# Patient Record
Sex: Female | Born: 1990
Health system: Southern US, Community
[De-identification: ages and names within clinical notes are randomized; demographics above are authoritative.]

## PROBLEM LIST (undated history)

## (undated) DIAGNOSIS — R519 Headache, unspecified: Secondary | ICD-10-CM

## (undated) DIAGNOSIS — Z789 Other specified health status: Secondary | ICD-10-CM

## (undated) DIAGNOSIS — I10 Essential (primary) hypertension: Secondary | ICD-10-CM

## (undated) DIAGNOSIS — O139 Gestational [pregnancy-induced] hypertension without significant proteinuria, unspecified trimester: Secondary | ICD-10-CM

## (undated) HISTORY — DX: Other specified health status: Z78.9

## (undated) HISTORY — DX: Essential (primary) hypertension: I10

## (undated) HISTORY — DX: Headache, unspecified: R51.9

## (undated) HISTORY — PX: NO PAST SURGERIES: SHX2092

---
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
## 2014-03-13 ENCOUNTER — Emergency Department (HOSPITAL_COMMUNITY)
Admission: EM | Admit: 2014-03-13 | Discharge: 2014-03-13 | Disposition: A | Discharge status: Home / Self Care | Payer: BC Managed Care – PPO | Attending: Emergency Medicine | Admitting: Emergency Medicine

## 2014-03-13 ENCOUNTER — Encounter (HOSPITAL_COMMUNITY): Payer: Self-pay | Admitting: Emergency Medicine

## 2014-03-13 DIAGNOSIS — G8929 Other chronic pain: Secondary | ICD-10-CM | POA: Insufficient documentation

## 2014-03-13 DIAGNOSIS — Z880 Allergy status to penicillin: Secondary | ICD-10-CM | POA: Diagnosis not present

## 2014-03-13 DIAGNOSIS — Z8679 Personal history of other diseases of the circulatory system: Secondary | ICD-10-CM | POA: Diagnosis not present

## 2014-03-13 DIAGNOSIS — R51 Headache: Secondary | ICD-10-CM | POA: Diagnosis present

## 2014-03-13 DIAGNOSIS — R519 Headache, unspecified: Secondary | ICD-10-CM

## 2014-03-13 MED ORDER — TRAMADOL-ACETAMINOPHEN 37.5-325 MG PO TABS: 1.0000 | ORAL_TABLET | Freq: Four times a day (QID) | ORAL | Status: DC | PRN

## 2014-03-13 MED ORDER — KETOROLAC TROMETHAMINE 60 MG/2ML IM SOLN
60.0000 mg | Freq: Once | INTRAMUSCULAR | Status: AC
  Administered 2014-03-13: 60 mg via INTRAMUSCULAR
  Filled 2014-03-13: qty 2

## 2014-03-13 MED ORDER — ONDANSETRON 4 MG PO TBDP
4.0000 mg | ORAL_TABLET | Freq: Once | ORAL | Status: AC
  Administered 2014-03-13: 4 mg via ORAL
  Filled 2014-03-13: qty 1

## 2014-03-13 NOTE — ED Notes (Signed)
Pt in c/o headache every day for the past three years, states she is normally able to manage this with ibuprofen at home but reports she has had a consistent headache for three days, denies n/v, no distress noted

## 2014-03-13 NOTE — Discharge Instructions (Signed)
Take the prescribed medication as directed. Follow-up with your primary care physician or you may be seen at the [facility]. Return to the ED for new or worsening symptoms.

## 2014-03-13 NOTE — ED Provider Notes (Signed)
CSN: [id number]     Arrival date & time 03/13/14  1916 History   First MD Initiated Contact with Patient 03/13/14 2038     Chief Complaint  Patient presents with  . Headache     (Consider location/radiation/quality/duration/timing/severity/associated sxs/prior Treatment) The history is provided by the patient and medical records.   A 23 year old female with past history significant for migraine headaches, presenting to the ED for headache. Patient states for the past 3 years she has had a headache nearly every day. She states headache is usually generalized throughout her entire head associated with photophobia and occasional nausea. She states today's headache feels similar, slightly increased pain on the left side of her head. She denies any phonophobia, aura, dizziness, lightheadedness, visual disturbance, tinnitus, changes in speech, numbness or weakness of extremities, or ataxia.  No fever chills. States headache is usually well controlled with Motrin, today's headache was unrelieved by this.  Patient initially hypertensive on arrival, corrected by time of evaluation without intervention.  History reviewed. No pertinent past medical history. History reviewed. No pertinent past surgical history. History reviewed. No pertinent family history. History  Substance Use Topics  . Smoking status: Never Smoker   . Smokeless tobacco: Not on file  . Alcohol Use: Not on file   OB History   Grav Para Term Preterm Abortions TAB SAB Ect Mult Living                 Review of Systems  Neurological: Positive for headaches.  All other systems reviewed and are negative.     Allergies  Amoxicillin  Home Medications   Prior to Admission medications   Not on File   BP 122/64  Pulse 84  Temp(Src) 98.4 F (36.9 C) (Oral)  Resp 18  Ht 5\' 5"  (1.651 m)  Wt 229 lb (103.874 kg)  BMI 38.11 kg/m2  SpO2 100%  LMP 02/26/2014  Physical Exam  Nursing note and vitals  reviewed. Constitutional: She is oriented to person, place, and time. She appears well-developed and well-nourished. No distress.  HENT:  Head: Normocephalic and atraumatic.  Mouth/Throat: Oropharynx is clear and moist.  Eyes: Conjunctivae and EOM are normal. Pupils are equal, round, and reactive to light.  Neck: Trachea normal, normal range of motion and full passive range of motion without pain. Neck supple. No spinous process tenderness and no muscular tenderness present. No rigidity.  No meningismus; full ROM maintained  Cardiovascular: Normal rate, regular rhythm and normal heart sounds.   Pulmonary/Chest: Effort normal and breath sounds normal. No respiratory distress. She has no wheezes.  Abdominal: Soft. Bowel sounds are normal. There is no tenderness. There is no guarding.  Musculoskeletal: Normal range of motion. She exhibits no edema.  Neurological: She is alert and oriented to person, place, and time.  AAOx3, answering questions appropriately; equal strength UE and LE bilaterally; CN grossly intact; moves all extremities appropriately without ataxia; no focal neuro deficits or facial asymmetry appreciated  Skin: Skin is warm and dry. She is not diaphoretic.  Psychiatric: She has a normal mood and affect.    ED Course  Procedures (including critical care time) Labs Review Labs Reviewed - No data to display  Imaging Review No results found.   EKG Interpretation None      MDM   Final diagnoses:  Headache, unspecified headache type   23 year old female with history of chronic headache, presenting today for headache. Unrelieved by Motrin at home.  On exam, patient afebrile and nontoxic appearing. No  nuchal rigidity to suggest meningitis. Neurologic exam is nonfocal-- low suspicion for intracranial pathology at this time. Patient given dose of Toradol and Zofran with complete resolution of her headache. States she is feeling well enough to go home. Prescription of tramadol  for home. Encouraged follow-up with primary care physician.  Discussed plan with patient, he/she acknowledged understanding and agreed with plan of care.  Return precautions given for new or worsening symptoms.  Garlon HatchetLisa M Fabienne Nolasco, PA-C 03/13/14 2253

## 2014-03-14 NOTE — ED Provider Notes (Signed)
Medical screening examination/treatment/procedure(s) were performed by non-physician practitioner and as supervising physician I was immediately available for consultation/collaboration.   EKG Interpretation None        Matthew Gentry, MD 03/14/14 0101 

## 2015-06-03 NOTE — L&D Delivery Note (Signed)
Patient is 25 y.o. G1P0 6197w3d admitted IOL for gHTN   Delivery Note At 7:30 AM a viable female was delivered via Vaginal, Spontaneous Delivery (Presentation: cephalic; LOA).  APGAR: 9, 10; weight  pending.   Placenta status: intact with trailing membranes, sent to pathology due to foul-smell and initial elevated infant temp.  Cord: 3 vessel. Without complications Anesthesia:  epidural Episiotomy: None Lacerations: None Suture Repair: none Est. Blood Loss (mL): 150  Mom to postpartum.  Baby to Couplet care / Skin to Skin.  Rachel Ritter 02/21/2016, 7:58 AM  Patient is a G1 at 5997w3d who was admitted for IOL due to gHTN.  She progressed with augmentation via cytotec, FB, and Pitocin.  I was gloved and present for delivery in its entirety.  Second stage of labor progressed, baby delivered after approx 10 contractions.  Mild decels during second stage noted.  Complications: none  Lacerations: none  EBL: 150cc  SHAW, KIMBERLY, CNM 9:44 AM  02/21/2016         Upon arrival patient was complete and pushing. She pushed with good maternal effort to deliver a healthy baby boy. Baby delivered without difficulty, was noted to have good tone and place on maternal abdomen for oral suctioning, drying and stimulation. Delayed cord clamping performed. Placenta delivered intact with 3V cord. Vaginal canal and perineum was inspected and no repairs deemed necessary; hemostatic. Pitocin was started and uterus massaged until bleeding slowed. Counts of sharps, instruments, and lap pads were all correct.   Rachel HerElsia J Yoo, DO PGY-1 9/21/20177:58 AM

## 2015-08-02 ENCOUNTER — Ambulatory Visit (INDEPENDENT_AMBULATORY_CARE_PROVIDER_SITE_OTHER): Payer: Medicaid Other | Admitting: General Practice

## 2015-08-02 ENCOUNTER — Inpatient Hospital Stay (HOSPITAL_COMMUNITY)
Admission: AD | Admit: 2015-08-02 | Discharge: 2015-08-02 | Disposition: A | Discharge status: Home / Self Care | Payer: Medicaid Other | Source: Ambulatory Visit | Attending: Obstetrics & Gynecology | Admitting: Obstetrics & Gynecology

## 2015-08-02 ENCOUNTER — Encounter: Payer: Self-pay | Admitting: General Practice

## 2015-08-02 DIAGNOSIS — Z3201 Encounter for pregnancy test, result positive: Secondary | ICD-10-CM

## 2015-08-02 DIAGNOSIS — Z3200 Encounter for pregnancy test, result unknown: Secondary | ICD-10-CM | POA: Diagnosis present

## 2015-08-02 NOTE — MAU Note (Signed)
Pt took 3 HPT's, all positive.  Denies pain or bleeding.  Wants pregnancy test confirmed.

## 2015-08-02 NOTE — Progress Notes (Signed)
Patient here today for pregnancy test- upt +. Reports first positive home test yesterday. LMP sometime in July. Patient reports irregular cycles. U/s scheduled for 08/08/15. Gave patient new OB packet, encouraged to take PNV and schedule new OB appt. Patient wishes to start care here. Patient verbalized understanding to all & had no questions

## 2015-08-03 LAB — POCT PREGNANCY, URINE: PREG TEST UR: POSITIVE — AB

## 2015-08-08 ENCOUNTER — Other Ambulatory Visit: Payer: Self-pay | Admitting: General Practice

## 2015-08-08 ENCOUNTER — Encounter (HOSPITAL_COMMUNITY): Payer: Self-pay

## 2015-08-08 ENCOUNTER — Ambulatory Visit (HOSPITAL_COMMUNITY)
Admission: RE | Admit: 2015-08-08 | Discharge: 2015-08-08 | Disposition: A | Discharge status: Home / Self Care | Payer: Medicaid Other | Source: Ambulatory Visit | Attending: Advanced Practice Midwife | Admitting: Advanced Practice Midwife

## 2015-08-08 DIAGNOSIS — Z3201 Encounter for pregnancy test, result positive: Secondary | ICD-10-CM

## 2015-08-08 DIAGNOSIS — Z3A10 10 weeks gestation of pregnancy: Secondary | ICD-10-CM | POA: Diagnosis not present

## 2015-08-08 DIAGNOSIS — Z36 Encounter for antenatal screening of mother: Secondary | ICD-10-CM | POA: Diagnosis not present

## 2015-08-08 DIAGNOSIS — Z3687 Encounter for antenatal screening for uncertain dates: Secondary | ICD-10-CM

## 2015-09-02 ENCOUNTER — Encounter (HOSPITAL_COMMUNITY): Payer: Self-pay | Admitting: *Deleted

## 2015-09-02 ENCOUNTER — Emergency Department (HOSPITAL_COMMUNITY)
Admission: EM | Admit: 2015-09-02 | Discharge: 2015-09-02 | Disposition: A | Discharge status: Home / Self Care | Payer: Medicaid Other | Attending: Emergency Medicine | Admitting: Emergency Medicine

## 2015-09-02 DIAGNOSIS — Z79899 Other long term (current) drug therapy: Secondary | ICD-10-CM | POA: Diagnosis not present

## 2015-09-02 DIAGNOSIS — Z880 Allergy status to penicillin: Secondary | ICD-10-CM | POA: Insufficient documentation

## 2015-09-02 DIAGNOSIS — J069 Acute upper respiratory infection, unspecified: Secondary | ICD-10-CM | POA: Diagnosis not present

## 2015-09-02 DIAGNOSIS — O99511 Diseases of the respiratory system complicating pregnancy, first trimester: Secondary | ICD-10-CM | POA: Diagnosis not present

## 2015-09-02 LAB — RAPID STREP SCREEN (MED CTR MEBANE ONLY): STREPTOCOCCUS, GROUP A SCREEN (DIRECT): NEGATIVE

## 2015-09-02 MED ORDER — GUAIFENESIN 100 MG/5ML PO LIQD: 100.0000 mg | ORAL | Status: DC | PRN

## 2015-09-02 MED ORDER — ACETAMINOPHEN 325 MG PO TABS
650.0000 mg | ORAL_TABLET | Freq: Once | ORAL | Status: AC
  Administered 2015-09-02: 650 mg via ORAL
  Filled 2015-09-02: qty 2

## 2015-09-02 MED ORDER — GUAIFENESIN 100 MG/5ML PO SOLN
5.0000 mL | Freq: Once | ORAL | Status: AC
  Administered 2015-09-02: 100 mg via ORAL
  Filled 2015-09-02: qty 5

## 2015-09-02 MED ORDER — ACETAMINOPHEN ER 650 MG PO TBCR: 650.0000 mg | EXTENDED_RELEASE_TABLET | Freq: Three times a day (TID) | ORAL | Status: DC | PRN

## 2015-09-02 NOTE — ED Provider Notes (Signed)
CSN: [id number]     Arrival date & time 09/02/15  0321 History   First MD Initiated Contact with Patient 09/02/15 0557     Chief Complaint  Patient presents with  . Headache  . Sore Throat  . Cough   (Consider location/radiation/quality/duration/timing/severity/associated sxs/prior Treatment) Patient is a 25 y.o. female presenting with headaches, pharyngitis, and cough. The history is provided by the patient. No language interpreter was used.  Headache Associated symptoms: cough and fever   Associated symptoms: no vomiting   Sore Throat Associated symptoms include coughing, a fever and headaches. Pertinent negatives include no chills or vomiting.  Cough Associated symptoms: fever and headaches   Associated symptoms: no chills    Miss Jon BillingsMorrison is a 25 year old female with no significant past medical history who is [redacted] weeks pregnant and here for gradual onset green sputum cough, headache, and sore throat that began 6 days ago. Reports she had a fever 6 days ago which resolved on its own. Denies any treatment prior to arrival. Denies any history of smoking. States she has had headaches in the past and that this is not new for her. Denies any vision changes, dizziness, lightheadedness. Denies any chills, night sweats, shortness of breath, nausea, vomiting.   History reviewed. No pertinent past medical history. History reviewed. No pertinent past surgical history. No family history on file. Social History  Substance Use Topics  . Smoking status: Never Smoker   . Smokeless tobacco: None  . Alcohol Use: No   OB History    Gravida Para Term Preterm AB TAB SAB Ectopic Multiple Living   1              Review of Systems  Constitutional: Positive for fever. Negative for chills.  Respiratory: Positive for cough.   Gastrointestinal: Negative for vomiting.  Neurological: Positive for headaches.  All other systems reviewed and are negative.     Allergies  Amoxicillin  Home  Medications   Prior to Admission medications   Medication Sig Start Date End Date Taking? Authorizing Provider  Prenatal Vit-Fe Fumarate-FA (PRENATAL MULTIVITAMIN) TABS tablet Take 2 tablets by mouth daily at 12 noon.   Yes Historical Provider, MD  acetaminophen (TYLENOL 8 HOUR) 650 MG CR tablet Take 1 tablet (650 mg total) by mouth every 8 (eight) hours as needed for pain. 09/02/15   Tieshia Rettinger Patel-Mills, PA-C  guaiFENesin (ROBITUSSIN) 100 MG/5ML liquid Take 5-10 mLs (100-200 mg total) by mouth every 4 (four) hours as needed for cough. 09/02/15   Dontasia Miranda Patel-Mills, PA-C   BP 129/92 mmHg  Pulse 93  Temp(Src) 98.3 F (36.8 C) (Oral)  Resp 32  SpO2 98% Physical Exam  Constitutional: She is oriented to person, place, and time. She appears well-developed and well-nourished. No distress.  HENT:  Head: Normocephalic and atraumatic.  Oropharynx is clear and moist. No tonsillar edema or exudates. Uvula midline. No trismus or drooling. No anterior cervical lymphadenopathy.  Eyes: Conjunctivae are normal.  Neck: Normal range of motion and full passive range of motion without pain. Neck supple. No spinous process tenderness present. No rigidity. Normal range of motion present. No Brudzinski's sign noted.  No meningeal signs. Able to touch chin to chest. Normal passive range of motion without pain.  Cardiovascular: Normal rate, regular rhythm and normal heart sounds.   Pulmonary/Chest: Effort normal and breath sounds normal.  Regular rate and rhythm. No murmur.  Lungs clear to auscultation bilaterally. No decreased breath sounds or wheezing.  Abdominal: Soft. There is no  tenderness.  Obese abdomen.  Musculoskeletal: Normal range of motion.  Neurological: She is alert and oriented to person, place, and time.  Skin: Skin is warm and dry.  Nursing note and vitals reviewed.   ED Course  Procedures (including critical care time) Labs Review Labs Reviewed  RAPID STREP SCREEN (NOT AT [facility])  CULTURE,  GROUP A STREP [facility])    Imaging Review No results found. I have personally reviewed and evaluated these lab results as part of my medical decision-making.   EKG Interpretation None      MDM   Final diagnoses:  URI, acute   Patient presents for URI like symptoms with headache, cough, and sore throat. Strep negative. This is most likely a viral upper respiratory infection. No signs of meningitis or pneumonia. States headaches are similar to previous headaches in the past. I discussed return precautions with the patient as well as follow-up. She was given Robitussin and Tylenol. Patient agrees with plan. Medications  guaiFENesin (ROBITUSSIN) 100 MG/5ML solution 100 mg (100 mg Oral Given 09/02/15 0651)  acetaminophen (TYLENOL) tablet 650 mg (650 mg Oral Given 09/02/15 0651)   Filed Vitals:   09/02/15 0615 09/02/15 0700  BP: 109/73 129/92  Pulse: 112 93  Temp:    Resp: 21 [address] PA-C 09/02/15 6063  Jerelyn Scott, MD 09/02/15 [id number]

## 2015-09-02 NOTE — ED Notes (Signed)
Pt is [redacted] weeks pregnant and here with cough with green sputum, headache, and sore throat.  No fever, no distress

## 2015-09-02 NOTE — ED Notes (Signed)
Pt reports that she is [redacted] weeks pregnant and does not have an OB.

## 2015-09-02 NOTE — Discharge Instructions (Signed)
Upper Respiratory Infection, Adult Follow up with women's outpatient clinic. Most upper respiratory infections (URIs) are caused by a virus. A URI affects the nose, throat, and upper air passages. The most common type of URI is often called "the common cold." HOME CARE   Take medicines only as told by your doctor.  Gargle warm saltwater or take cough drops to comfort your throat as told by your doctor.  Use a warm mist humidifier or inhale steam from a shower to increase air moisture. This may make it easier to breathe.  Drink enough fluid to keep your pee (urine) clear or pale yellow.  Eat soups and other clear broths.  Have a healthy diet.  Rest as needed.  Go back to work when your fever is gone or your doctor says it is okay.  You may need to stay home longer to avoid giving your URI to others.  You can also wear a face mask and wash your hands often to prevent spread of the virus.  Use your inhaler more if you have asthma.  Do not use any tobacco products, including cigarettes, chewing tobacco, or electronic cigarettes. If you need help quitting, ask your doctor. GET HELP IF:  You are getting worse, not better.  Your symptoms are not helped by medicine.  You have chills.  You are getting more short of breath.  You have brown or red mucus.  You have yellow or brown discharge from your nose.  You have pain in your face, especially when you bend forward.  You have a fever.  You have puffy (swollen) neck glands.  You have pain while swallowing.  You have white areas in the back of your throat. GET HELP RIGHT AWAY IF:   You have very bad or constant:  Headache.  Ear pain.  Pain in your forehead, behind your eyes, and over your cheekbones (sinus pain).  Chest pain.  You have long-lasting (chronic) lung disease and any of the following:  Wheezing.  Long-lasting cough.  Coughing up blood.  A change in your usual mucus.  You have a stiff  neck.  You have changes in your:  Vision.  Hearing.  Thinking.  Mood. MAKE SURE YOU:   Understand these instructions.  Will watch your condition.  Will get help right away if you are not doing well or get worse.   This information is not intended to replace advice given to you by your health care provider. Make sure you discuss any questions you have with your health care provider.   Document Released: 11/05/2007 Document Revised: 10/03/2014 Document Reviewed: 08/24/2013 Elsevier Interactive Patient Education [employer]2016 [employer].

## 2015-09-02 NOTE — ED Notes (Signed)
Pt verbalizes understanding of instructions. 

## 2015-09-04 LAB — CULTURE, GROUP A STREP (THRC)

## 2015-09-05 ENCOUNTER — Other Ambulatory Visit (HOSPITAL_COMMUNITY)
Admission: RE | Admit: 2015-09-05 | Discharge: 2015-09-05 | Disposition: A | Discharge status: Home / Self Care | Payer: Medicaid Other | Source: Ambulatory Visit | Attending: Certified Nurse Midwife | Admitting: Certified Nurse Midwife

## 2015-09-05 ENCOUNTER — Encounter: Payer: Self-pay | Admitting: Certified Nurse Midwife

## 2015-09-05 ENCOUNTER — Ambulatory Visit (INDEPENDENT_AMBULATORY_CARE_PROVIDER_SITE_OTHER): Payer: Medicaid Other | Admitting: Certified Nurse Midwife

## 2015-09-05 VITALS — BP 132/87 | HR 111 | Wt 235.7 lb

## 2015-09-05 DIAGNOSIS — Z113 Encounter for screening for infections with a predominantly sexual mode of transmission: Secondary | ICD-10-CM | POA: Insufficient documentation

## 2015-09-05 DIAGNOSIS — Z3402 Encounter for supervision of normal first pregnancy, second trimester: Secondary | ICD-10-CM | POA: Diagnosis not present

## 2015-09-05 DIAGNOSIS — Z1389 Encounter for screening for other disorder: Secondary | ICD-10-CM

## 2015-09-05 DIAGNOSIS — Z23 Encounter for immunization: Secondary | ICD-10-CM | POA: Diagnosis not present

## 2015-09-05 LAB — POCT URINALYSIS DIP (DEVICE)
Bilirubin Urine: NEGATIVE
Glucose, UA: NEGATIVE mg/dL
Hgb urine dipstick: NEGATIVE
Ketones, ur: NEGATIVE mg/dL
Leukocytes, UA: NEGATIVE
Nitrite: NEGATIVE
Protein, ur: NEGATIVE mg/dL
Specific Gravity, Urine: 1.02 (ref 1.005–1.030)
Urobilinogen, UA: 1 mg/dL (ref 0.0–1.0)
pH: 6.5 (ref 5.0–8.0)

## 2015-09-05 NOTE — Progress Notes (Signed)
   Subjective:    Rachel Ritter is a G1P0 6847w2d being seen today for her first obstetrical visit.  Her obstetrical history is significant for none. Patient undecided intend to breast feed. Pregnancy history fully reviewed.  Patient reports no complaints.  Filed Vitals:   09/05/15 0912  BP: 132/87  Pulse: 111  Weight: 235 lb 11.2 oz (106.913 kg)    HISTORY: OB History  Gravida Para Term Preterm AB SAB TAB Ectopic Multiple Living  1             # Outcome Date GA Lbr Len/2nd Weight Sex Delivery Anes PTL Lv  1 Current              History reviewed. No pertinent past medical history. History reviewed. No pertinent past surgical history. History reviewed. No pertinent family history.   Exam    Uterus:     Pelvic Exam:    Perineum:    Vulva:    Vagina:     pH:    Cervix:    Adnexa:    Bony Pelvis:   System: Breast:     Skin: normal coloration and turgor, no rashes    Neurologic: oriented, normal   Extremities: normal strength, tone, and muscle mass   HEENT    Mouth/Teeth mucous membranes moist, pharynx normal without lesions   Neck supple and no masses   Cardiovascular: regular rate and rhythm   Respiratory:  appears well, vitals normal, no respiratory distress, acyanotic, normal RR, ear and throat exam is normal, neck free of mass or lymphadenopathy, chest clear, no wheezing, crepitations, rhonchi, normal symmetric air entry   Abdomen: soft, non-tender; bowel sounds normal; no masses,  no organomegaly   Urinary:       Assessment:    Pregnancy: G1P0 Patient Active Problem List   Diagnosis Date Noted  . Supervision of normal first pregnancy in second trimester 09/05/2015        Plan:     Initial labs drawn. Prenatal vitamins. Problem list reviewed and updated. Genetic Screening discussed : declined.  Ultrasound discussed; fetal survey: declined.  Follow up in 4 weeks. 50% of 30 min visit spent on counseling and coordination of care.     Clemmons,Lori Grissett 09/05/2015

## 2015-09-05 NOTE — Patient Instructions (Signed)
Safe Medications in Pregnancy   Acne: Benzoyl Peroxide Salicylic Acid  Backache/Headache: Tylenol: 2 regular strength every 4 hours OR              2 Extra strength every 6 hours  Colds/Coughs/Allergies: Benadryl (alcohol free) 25 mg every 6 hours as needed Breath right strips Claritin Cepacol throat lozenges Chloraseptic throat spray Cold-Eeze- up to three times per day Cough drops, alcohol free Flonase (by prescription only) Guaifenesin Mucinex Robitussin DM (plain only, alcohol free) Saline nasal spray/drops Sudafed (pseudoephedrine) & Actifed ** use only after [redacted] weeks gestation and if you do not have high blood pressure Tylenol Vicks Vaporub Zinc lozenges Zyrtec   Constipation: Colace Ducolax suppositories Fleet enema Glycerin suppositories Metamucil Milk of magnesia Miralax Senokot Smooth move tea  Diarrhea: Kaopectate Imodium A-D  *NO pepto Bismol  Hemorrhoids: Anusol Anusol HC Preparation H Tucks  Indigestion: Tums Maalox Mylanta Zantac  Pepcid  Insomnia: Benadryl (alcohol free) 25mg  every 6 hours as needed Tylenol PM Unisom, no Gelcaps  Leg Cramps: Tums MagGel  Nausea/Vomiting:  Bonine Dramamine Emetrol Ginger extract Sea bands Meclizine  Nausea medication to take during pregnancy:  Unisom (doxylamine succinate 25 mg tablets) Take one tablet daily at bedtime. If symptoms are not adequately controlled, the dose can be increased to a maximum recommended dose of two tablets daily (1/2 tablet in the morning, 1/2 tablet mid-afternoon and one at bedtime). Vitamin B6 100mg  tablets. Take one tablet twice a day (up to 200 mg per day).  Skin Rashes: Aveeno products Benadryl cream or 25mg  every 6 hours as needed Calamine Lotion 1% cortisone cream  Yeast infection: Gyne-lotrimin 7 Monistat 7   **If taking multiple medications, please check labels to avoid duplicating the same active ingredients **take medication as directed on  the label ** Do not exceed 4000 mg of tylenol in 24 hours **Do not take medications that contain aspirin or ibuprofen   How a Baby Grows During Pregnancy Pregnancy begins when a female's sperm enters a female's egg (fertilization). This happens in one of the tubes (fallopian tubes) that connect the ovaries to the womb (uterus). The fertilized egg is called an embryo until it reaches 10 weeks. From 10 weeks until birth, it is called a fetus. The fertilized egg moves down the fallopian tube to the uterus. Then it implants into the lining of the uterus and begins to grow. The developing fetus receives oxygen and nutrients through the pregnant woman's bloodstream and the tissues that grow (placenta) to support the fetus. The placenta is the life support system for the fetus. It provides nutrition and removes waste. Learning as much as you can about your pregnancy and how your baby is developing can help you enjoy the experience. It can also make you aware of when there might be a problem and when to ask questions. HOW LONG DOES A TYPICAL PREGNANCY LAST? A pregnancy usually lasts 280 days, or about 40 weeks. Pregnancy is divided into three trimesters:  First trimester: 0-13 weeks.  Second trimester: 14-27 weeks.  Third trimester: 28-40 weeks. The day when your baby is considered ready to be born (full term) is your estimated date of delivery. HOW DOES MY BABY DEVELOP MONTH BY MONTH? First month  The fertilized egg attaches to the inside of the uterus.  Some cells will form the placenta. Others will form the fetus.  The arms, legs, brain, spinal cord, lungs, and heart begin to develop.  At the end of the first month, the heart  begins to beat. Second month  The bones, inner ear, eyelids, hands, and feet form.  The genitals develop.  By the end of 8 weeks, all major organs are developing. Third month  All of the internal organs are forming.  Teeth develop below the gums.  Bones and  muscles begin to grow. The spine can flex.  The skin is transparent.  Fingernails and toenails begin to form.  Arms and legs continue to grow longer, and hands and feet develop.  The fetus is about 3 in (7.6 cm) long. Fourth month  The placenta is completely formed.  The external sex organs, neck, outer ear, eyebrows, eyelids, and fingernails are formed.  The fetus can hear, swallow, and move its arms and legs.  The kidneys begin to produce urine.  The skin is covered with a white waxy coating (vernix) and very fine hair (lanugo). Fifth month  The fetus moves around more and can be felt for the first time (quickening).  The fetus starts to sleep and wake up and may begin to suck its finger.  The nails grow to the end of the fingers.  The organ in the digestive system that makes bile (gallbladder) functions and helps to digest the nutrients.  If your baby is a girl, eggs are present in her ovaries. If your baby is a boy, testicles start to move down into his scrotum. Sixth month  The lungs are formed, but the fetus is not yet able to breathe.  The eyes open. The brain continues to develop.  Your baby has fingerprints and toe prints. Your baby's hair grows thicker.  At the end of the second trimester, the fetus is about 9 in (22.9 cm) long. Seventh month  The fetus kicks and stretches.  The eyes are developed enough to sense changes in light.  The hands can make a grasping motion.  The fetus responds to sound. Eighth month  All organs and body systems are fully developed and functioning.  Bones harden and taste buds develop. The fetus may hiccup.  Certain areas of the brain are still developing. The skull remains soft. Ninth month  The fetus gains about  lb (0.23 kg) each week.  The lungs are fully developed.  Patterns of sleep develop.  The fetus's head typically moves into a head-down position (vertex) in the uterus to prepare for birth. If the  buttocks move into a vertex position instead, the baby is breech.  The fetus weighs 6-9 lbs (2.72-4.08 kg) and is 19-20 in (48.26-50.8 cm) long. WHAT CAN I DO TO HAVE A HEALTHY PREGNANCY AND HELP MY BABY DEVELOP? Eating and Drinking  Eat a healthy diet.  Talk with your health care provider to make sure that you are getting the nutrients that you and your baby need.  Visit www.DisposableNylon.be to learn about creating a healthy diet.  Gain a healthy amount of weight during pregnancy as advised by your health care provider. This is usually 25-35 pounds. You may need to:  Gain more if you were underweight before getting pregnant or if you are pregnant with more than one baby.  Gain less if you were overweight or obese when you got pregnant. Medicines and Vitamins  Take prenatal vitamins as directed by your health care provider. These include vitamins such as folic acid, iron, calcium, and vitamin D. They are important for healthy development.  Take medicines only as directed by your health care provider. Read labels and ask a pharmacist or your health care  provider whether over-the-counter medicines, supplements, and prescription drugs are safe to take during pregnancy. Activities  Be physically active as advised by your health care provider. Ask your health care provider to recommend activities that are safe for you to do, such as walking or swimming.  Do not participate in strenuous or extreme sports. Lifestyle  Do not drink alcohol.  Do not use any tobacco products, including cigarettes, chewing tobacco, or electronic cigarettes. If you need help quitting, ask your health care provider.  Do not use illegal drugs. Safety  Avoid exposure to mercury, lead, or other heavy metals. Ask your health care provider about common sources of these heavy metals.  Avoid listeria infection during pregnancy. Follow these precautions:  Do not eat soft cheeses or deli meats.  Do not eat hot  dogs unless they have been warmed up to the point of steaming, such as in the microwave oven.  Do not drink unpasteurized milk.  Avoid toxoplasmosis infection during pregnancy. Follow these precautions:  Do not change your cat's litter box, if you have a cat. Ask someone else to do this for you.  Wear gardening gloves while working in the yard. General Instructions  Keep all follow-up visits as directed by your health care provider. This is important. This includes prenatal care and screening tests.  Manage any chronic health conditions. Work closely with your health care provider to keep conditions, such as diabetes, under control. HOW DO I KNOW IF MY BABY IS DEVELOPING WELL? At each prenatal visit, your health care provider will do several different tests to check on your health and keep track of your baby's development. These include:  Fundal height.  Your health care provider will measure your growing belly from top to bottom using a tape measure.  Your health care provider will also feel your belly to determine your baby's position.  Heartbeat.  An ultrasound in the first trimester can confirm pregnancy and show a heartbeat, depending on how far along you are.  Your health care provider will check your baby's heart rate at every prenatal visit.  As you get closer to your delivery date, you may have regular fetal heart rate monitoring to make sure that your baby is not in distress.  Second trimester ultrasound.  This ultrasound checks your baby's development. It also indicates your baby's gender. WHAT SHOULD I DO IF I HAVE CONCERNS ABOUT MY BABY'S DEVELOPMENT? Always talk with your health care provider about any concerns that you may have.   This information is not intended to replace advice given to you by your health care provider. Make sure you discuss any questions you have with your health care provider.   Document Released: 11/05/2007 Document Revised: 02/07/2015  Document Reviewed: 10/26/2013 Elsevier Interactive Patient Education 2016 [employer] Influenza Virus Vaccine injection What is this medicine? INFLUENZA VIRUS VACCINE (in floo EN zuh VAHY ruhs vak SEEN) helps to reduce the risk of getting influenza also known as the flu. The vaccine only helps protect you against some strains of the flu. This medicine may be used for other purposes; ask your health care provider or pharmacist if you have questions. What should I tell my health care provider before I take this medicine? They need to know if you have any of these conditions: -bleeding disorder like hemophilia -fever or infection -Guillain-Barre syndrome or other neurological problems -immune system problems -infection with the human immunodeficiency virus (HIV) or AIDS -low blood platelet counts -multiple sclerosis -an unusual or allergic reaction  to influenza virus vaccine, latex, other medicines, foods, dyes, or preservatives. Different brands of vaccines contain different allergens. Some may contain latex or eggs. Talk to your doctor about your allergies to make sure that you get the right vaccine. -pregnant or trying to get pregnant -breast-feeding How should I use this medicine? This vaccine is for injection into a muscle or under the skin. It is given by a health care professional. A copy of Vaccine Information Statements will be given before each vaccination. Read this sheet carefully each time. The sheet may change frequently. Talk to your healthcare provider to see which vaccines are right for you. Some vaccines should not be used in all age groups. Overdosage: If you think you have taken too much of this medicine contact a poison control center or emergency room at once. NOTE: This medicine is only for you. Do not share this medicine with others. What if I miss a dose? This does not apply. What may interact with this medicine? -chemotherapy or radiation therapy -medicines that  lower your immune system like etanercept, anakinra, infliximab, and adalimumab -medicines that treat or prevent blood clots like warfarin -phenytoin -steroid medicines like prednisone or cortisone -theophylline -vaccines This list may not describe all possible interactions. Give your health care provider a list of all the medicines, herbs, non-prescription drugs, or dietary supplements you use. Also tell them if you smoke, drink alcohol, or use illegal drugs. Some items may interact with your medicine. What should I watch for while using this medicine? Report any side effects that do not go away within 3 days to your doctor or health care professional. Call your health care provider if any unusual symptoms occur within 6 weeks of receiving this vaccine. You may still catch the flu, but the illness is not usually as bad. You cannot get the flu from the vaccine. The vaccine will not protect against colds or other illnesses that may cause fever. The vaccine is needed every year. What side effects may I notice from receiving this medicine? Side effects that you should report to your doctor or health care professional as soon as possible: -allergic reactions like skin rash, itching or hives, swelling of the face, lips, or tongue Side effects that usually do not require medical attention (report to your doctor or health care professional if they continue or are bothersome): -fever -headache -muscle aches and pains -pain, tenderness, redness, or swelling at the injection site -tiredness This list may not describe all possible side effects. Call your doctor for medical advice about side effects. You may report side effects to FDA at 1-800-FDA-1088. Where should I keep my medicine? The vaccine will be given by a health care professional in a clinic, pharmacy, doctor's office, or other health care setting. You will not be given vaccine doses to store at home. NOTE: This sheet is a summary. It may not  cover all possible information. If you have questions about this medicine, talk to your doctor, pharmacist, or health care provider.    2016, Elsevier/[employer]. (2014-12-08 10:07:28) Second Trimester of Pregnancy The second trimester is from week 13 through week 28, months 4 through 6. The second trimester is often a time when you feel your best. Your body has also adjusted to being pregnant, and you begin to feel better physically. Usually, morning sickness has lessened or quit completely, you may have more energy, and you may have an increase in appetite. The second trimester is also a time when the fetus is  growing rapidly. At the end of the sixth month, the fetus is about 9 inches long and weighs about 1 pounds. You will likely begin to feel the baby move (quickening) between 18 and 20 weeks of the pregnancy. BODY CHANGES Your body goes through many changes during pregnancy. The changes vary from woman to woman.   Your weight will continue to increase. You will notice your lower abdomen bulging out.  You may begin to get stretch marks on your hips, abdomen, and breasts.  You may develop headaches that can be relieved by medicines approved by your health care provider.  You may urinate more often because the fetus is pressing on your bladder.  You may develop or continue to have heartburn as a result of your pregnancy.  You may develop constipation because certain hormones are causing the muscles that push waste through your intestines to slow down.  You may develop hemorrhoids or swollen, bulging veins (varicose veins).  You may have back pain because of the weight gain and pregnancy hormones relaxing your joints between the bones in your pelvis and as a result of a shift in weight and the muscles that support your balance.  Your breasts will continue to grow and be tender.  Your gums may bleed and may be sensitive to brushing and flossing.  Dark spots or blotches (chloasma,  mask of pregnancy) may develop on your face. This will likely fade after the baby is born.  A dark line from your belly button to the pubic area (linea nigra) may appear. This will likely fade after the baby is born.  You may have changes in your hair. These can include thickening of your hair, rapid growth, and changes in texture. Some women also have hair loss during or after pregnancy, or hair that feels dry or thin. Your hair will most likely return to normal after your baby is born. WHAT TO EXPECT AT YOUR PRENATAL VISITS During a routine prenatal visit:  You will be weighed to make sure you and the fetus are growing normally.  Your blood pressure will be taken.  Your abdomen will be measured to track your baby's growth.  The fetal heartbeat will be listened to.  Any test results from the previous visit will be discussed. Your health care provider may ask you:  How you are feeling.  If you are feeling the baby move.  If you have had any abnormal symptoms, such as leaking fluid, bleeding, severe headaches, or abdominal cramping.  If you are using any tobacco products, including cigarettes, chewing tobacco, and electronic cigarettes.  If you have any questions. Other tests that may be performed during your second trimester include:  Blood tests that check for:  Low iron levels (anemia).  Gestational diabetes (between 24 and 28 weeks).  Rh antibodies.  Urine tests to check for infections, diabetes, or protein in the urine.  An ultrasound to confirm the proper growth and development of the baby.  An amniocentesis to check for possible genetic problems.  Fetal screens for spina bifida and Down syndrome.  HIV (human immunodeficiency virus) testing. Routine prenatal testing includes screening for HIV, unless you choose not to have this test. HOME CARE INSTRUCTIONS   Avoid all smoking, herbs, alcohol, and unprescribed drugs. These chemicals affect the formation and growth  of the baby.  Do not use any tobacco products, including cigarettes, chewing tobacco, and electronic cigarettes. If you need help quitting, ask your health care provider. You may receive counseling support  and other resources to help you quit.  Follow your health care provider's instructions regarding medicine use. There are medicines that are either safe or unsafe to take during pregnancy.  Exercise only as directed by your health care provider. Experiencing uterine cramps is a good sign to stop exercising.  Continue to eat regular, healthy meals.  Wear a good support bra for breast tenderness.  Do not use hot tubs, steam rooms, or saunas.  Wear your seat belt at all times when driving.  Avoid raw meat, uncooked cheese, cat litter boxes, and soil used by cats. These carry germs that can cause birth defects in the baby.  Take your prenatal vitamins.  Take 1500-2000 mg of calcium daily starting at the 20th week of pregnancy until you deliver your baby.  Try taking a stool softener (if your health care provider approves) if you develop constipation. Eat more high-fiber foods, such as fresh vegetables or fruit and whole grains. Drink plenty of fluids to keep your urine clear or pale yellow.  Take warm sitz baths to soothe any pain or discomfort caused by hemorrhoids. Use hemorrhoid cream if your health care provider approves.  If you develop varicose veins, wear support hose. Elevate your feet for 15 minutes, 3-4 times a day. Limit salt in your diet.  Avoid heavy lifting, wear low heel shoes, and practice good posture.  Rest with your legs elevated if you have leg cramps or low back pain.  Visit your dentist if you have not gone yet during your pregnancy. Use a soft toothbrush to brush your teeth and be gentle when you floss.  A sexual relationship may be continued unless your health care provider directs you otherwise.  Continue to go to all your prenatal visits as directed by your  health care provider. SEEK MEDICAL CARE IF:   You have dizziness.  You have mild pelvic cramps, pelvic pressure, or nagging pain in the abdominal area.  You have persistent nausea, vomiting, or diarrhea.  You have a bad smelling vaginal discharge.  You have pain with urination. SEEK IMMEDIATE MEDICAL CARE IF:   You have a fever.  You are leaking fluid from your vagina.  You have spotting or bleeding from your vagina.  You have severe abdominal cramping or pain.  You have rapid weight gain or loss.  You have shortness of breath with chest pain.  You notice sudden or extreme swelling of your face, hands, ankles, feet, or legs.  You have not felt your baby move in over an hour.  You have severe headaches that do not go away with medicine.  You have vision changes.   This information is not intended to replace advice given to you by your health care provider. Make sure you discuss any questions you have with your health care provider.   Document Released: 05/13/2001 Document Revised: 06/09/2014 Document Reviewed: 07/20/2012 Elsevier Interactive Patient Education [employer]2016 [employer].

## 2015-09-06 LAB — PRENATAL PROFILE (SOLSTAS)
Antibody Screen: NEGATIVE
Basophils Absolute: 0 cells/uL (ref 0–200)
Basophils Relative: 0 %
Eosinophils Absolute: 0 cells/uL — ABNORMAL LOW (ref 15–500)
Eosinophils Relative: 0 %
HCT: 38.3 % (ref 35.0–45.0)
HIV 1&2 Ab, 4th Generation: NONREACTIVE
Hemoglobin: 12.8 g/dL (ref 11.7–15.5)
Hepatitis B Surface Ag: NEGATIVE
Lymphocytes Relative: 22 %
Lymphs Abs: 1782 cells/uL (ref 850–3900)
MCH: 28.9 pg (ref 27.0–33.0)
MCHC: 33.4 g/dL (ref 32.0–36.0)
MCV: 86.5 fL (ref 80.0–100.0)
MPV: 8.9 fL (ref 7.5–12.5)
Monocytes Absolute: 405 cells/uL (ref 200–950)
Monocytes Relative: 5 %
Neutro Abs: 5913 cells/uL (ref 1500–7800)
Neutrophils Relative %: 73 %
Platelets: 298 10*3/uL (ref 140–400)
RBC: 4.43 MIL/uL (ref 3.80–5.10)
RDW: 14.2 % (ref 11.0–15.0)
Rh Type: POSITIVE
Rubella: 2.49 Index — ABNORMAL HIGH (ref <0.90)
WBC: 8.1 10*3/uL (ref 3.8–10.8)

## 2015-09-06 LAB — GC/CHLAMYDIA PROBE AMP (~~LOC~~) NOT AT ARMC
Chlamydia: NEGATIVE
Neisseria Gonorrhea: NEGATIVE

## 2015-09-06 LAB — CULTURE, OB URINE: Colony Count: 4000

## 2015-09-07 LAB — PRESCRIPTION MONITORING PROFILE (19 PANEL)
Amphetamine/Meth: NEGATIVE ng/mL
Barbiturate Screen, Urine: NEGATIVE ng/mL
Benzodiazepine Screen, Urine: NEGATIVE ng/mL
Buprenorphine, Urine: NEGATIVE ng/mL
Cannabinoid Scrn, Ur: NEGATIVE ng/mL
Carisoprodol, Urine: NEGATIVE ng/mL
Cocaine Metabolites: NEGATIVE ng/mL
Creatinine, Urine: 153 mg/dL (ref >20.0)
Fentanyl, Ur: NEGATIVE ng/mL
MDMA URINE: NEGATIVE ng/mL
Meperidine, Ur: NEGATIVE ng/mL
Methadone Screen, Urine: NEGATIVE ng/mL
Methaqualone: NEGATIVE ng/mL
Nitrites, Initial: NEGATIVE ug/mL
Opiate Screen, Urine: NEGATIVE ng/mL
Oxycodone Screen, Ur: NEGATIVE ng/mL
Phencyclidine, Ur: NEGATIVE ng/mL
Propoxyphene: NEGATIVE ng/mL
Tapentadol, urine: NEGATIVE ng/mL
Tramadol Scrn, Ur: NEGATIVE ng/mL
Zolpidem, Urine: NEGATIVE ng/mL
pH, Initial: 6.9 pH (ref 4.5–8.9)

## 2015-10-03 ENCOUNTER — Encounter: Payer: Self-pay | Admitting: Student

## 2015-10-03 ENCOUNTER — Ambulatory Visit (INDEPENDENT_AMBULATORY_CARE_PROVIDER_SITE_OTHER): Payer: Medicaid Other | Admitting: Student

## 2015-10-03 VITALS — BP 114/89 | HR 86 | Wt 236.8 lb

## 2015-10-03 DIAGNOSIS — Z3402 Encounter for supervision of normal first pregnancy, second trimester: Secondary | ICD-10-CM

## 2015-10-03 DIAGNOSIS — E669 Obesity, unspecified: Secondary | ICD-10-CM

## 2015-10-03 DIAGNOSIS — O99212 Obesity complicating pregnancy, second trimester: Secondary | ICD-10-CM

## 2015-10-03 LAB — POCT URINALYSIS DIP (DEVICE)
Bilirubin Urine: NEGATIVE
GLUCOSE, UA: NEGATIVE mg/dL
Hgb urine dipstick: NEGATIVE
Ketones, ur: NEGATIVE mg/dL
LEUKOCYTES UA: NEGATIVE
NITRITE: NEGATIVE
Protein, ur: NEGATIVE mg/dL
SPECIFIC GRAVITY, URINE: 1.02 (ref 1.005–1.030)
UROBILINOGEN UA: 1 mg/dL (ref 0.0–1.0)
pH: 7 (ref 5.0–8.0)

## 2015-10-03 NOTE — Patient Instructions (Addendum)
Second Trimester of Pregnancy The second trimester is from week 13 through week 28, months 4 through 6. The second trimester is often a time when you feel your best. Your body has also adjusted to being pregnant, and you begin to feel better physically. Usually, morning sickness has lessened or quit completely, you may have more energy, and you may have an increase in appetite. The second trimester is also a time when the fetus is growing rapidly. At the end of the sixth month, the fetus is about 9 inches long and weighs about 1 pounds. You will likely begin to feel the baby move (quickening) between 18 and 20 weeks of the pregnancy. BODY CHANGES Your body goes through many changes during pregnancy. The changes vary from woman to woman.   Your weight will continue to increase. You will notice your lower abdomen bulging out.  You may begin to get stretch marks on your hips, abdomen, and breasts.  You may develop headaches that can be relieved by medicines approved by your health care provider.  You may urinate more often because the fetus is pressing on your bladder.  You may develop or continue to have heartburn as a result of your pregnancy.  You may develop constipation because certain hormones are causing the muscles that push waste through your intestines to slow down.  You may develop hemorrhoids or swollen, bulging veins (varicose veins).  You may have back pain because of the weight gain and pregnancy hormones relaxing your joints between the bones in your pelvis and as a result of a shift in weight and the muscles that support your balance.  Your breasts will continue to grow and be tender.  Your gums may bleed and may be sensitive to brushing and flossing.  Dark spots or blotches (chloasma, mask of pregnancy) may develop on your face. This will likely fade after the baby is born.  A dark line from your belly button to the pubic area (linea nigra) may appear. This will likely fade  after the baby is born.  You may have changes in your hair. These can include thickening of your hair, rapid growth, and changes in texture. Some women also have hair loss during or after pregnancy, or hair that feels dry or thin. Your hair will most likely return to normal after your baby is born. WHAT TO EXPECT AT YOUR PRENATAL VISITS During a routine prenatal visit:  You will be weighed to make sure you and the fetus are growing normally.  Your blood pressure will be taken.  Your abdomen will be measured to track your baby's growth.  The fetal heartbeat will be listened to.  Any test results from the previous visit will be discussed. Your health care provider may ask you:  How you are feeling.  If you are feeling the baby move.  If you have had any abnormal symptoms, such as leaking fluid, bleeding, severe headaches, or abdominal cramping.  If you are using any tobacco products, including cigarettes, chewing tobacco, and electronic cigarettes.  If you have any questions. Other tests that may be performed during your second trimester include:  Blood tests that check for:  Low iron levels (anemia).  Gestational diabetes (between 24 and 28 weeks).  Rh antibodies.  Urine tests to check for infections, diabetes, or protein in the urine.  An ultrasound to confirm the proper growth and development of the baby.  An amniocentesis to check for possible genetic problems.  Fetal screens for spina bifida   and Down syndrome.  HIV (human immunodeficiency virus) testing. Routine prenatal testing includes screening for HIV, unless you choose not to have this test. HOME CARE INSTRUCTIONS   Avoid all smoking, herbs, alcohol, and unprescribed drugs. These chemicals affect the formation and growth of the baby.  Do not use any tobacco products, including cigarettes, chewing tobacco, and electronic cigarettes. If you need help quitting, ask your health care provider. You may receive  counseling support and other resources to help you quit.  Follow your health care provider's instructions regarding medicine use. There are medicines that are either safe or unsafe to take during pregnancy.  Exercise only as directed by your health care provider. Experiencing uterine cramps is a good sign to stop exercising.  Continue to eat regular, healthy meals.  Wear a good support bra for breast tenderness.  Do not use hot tubs, steam rooms, or saunas.  Wear your seat belt at all times when driving.  Avoid raw meat, uncooked cheese, cat litter boxes, and soil used by cats. These carry germs that can cause birth defects in the baby.  Take your prenatal vitamins.  Take 1500-2000 mg of calcium daily starting at the 20th week of pregnancy until you deliver your baby.  Try taking a stool softener (if your health care provider approves) if you develop constipation. Eat more high-fiber foods, such as fresh vegetables or fruit and whole grains. Drink plenty of fluids to keep your urine clear or pale yellow.  Take warm sitz baths to soothe any pain or discomfort caused by hemorrhoids. Use hemorrhoid cream if your health care provider approves.  If you develop varicose veins, wear support hose. Elevate your feet for 15 minutes, 3-4 times a day. Limit salt in your diet.  Avoid heavy lifting, wear low heel shoes, and practice good posture.  Rest with your legs elevated if you have leg cramps or low back pain.  Visit your dentist if you have not gone yet during your pregnancy. Use a soft toothbrush to brush your teeth and be gentle when you floss.  A sexual relationship may be continued unless your health care provider directs you otherwise.  Continue to go to all your prenatal visits as directed by your health care provider. SEEK MEDICAL CARE IF:   You have dizziness.  You have mild pelvic cramps, pelvic pressure, or nagging pain in the abdominal area.  You have persistent nausea,  vomiting, or diarrhea.  You have a bad smelling vaginal discharge.  You have pain with urination. SEEK IMMEDIATE MEDICAL CARE IF:   You have a fever.  You are leaking fluid from your vagina.  You have spotting or bleeding from your vagina.  You have severe abdominal cramping or pain.  You have rapid weight gain or loss.  You have shortness of breath with chest pain.  You notice sudden or extreme swelling of your face, hands, ankles, feet, or legs.  You have not felt your baby move in over an hour.  You have severe headaches that do not go away with medicine.  You have vision changes.   This information is not intended to replace advice given to you by your health care provider. Make sure you discuss any questions you have with your health care provider.   Document Released: 05/13/2001 Document Revised: 06/09/2014 Document Reviewed: 07/20/2012 Elsevier Interactive Patient Education 2016 [employer]    Safe Medications in Pregnancy   Acne: Benzoyl Peroxide Salicylic Acid  Backache/Headache: Tylenol: 2 regular strength every 4   hours OR              2 Extra strength every 6 hours  Colds/Coughs/Allergies: Benadryl (alcohol free) 25 mg every 6 hours as needed Breath right strips Claritin Cepacol throat lozenges Chloraseptic throat spray Cold-Eeze- up to three times per day Cough drops, alcohol free Flonase (by prescription only) Guaifenesin Mucinex Robitussin DM (plain only, alcohol free) Saline nasal spray/drops Sudafed (pseudoephedrine) & Actifed ** use only after [redacted] weeks gestation and if you do not have high blood pressure Tylenol Vicks Vaporub Zinc lozenges Zyrtec   Constipation: Colace Ducolax suppositories Fleet enema Glycerin suppositories Metamucil Milk of magnesia Miralax Senokot Smooth move tea  Diarrhea: Kaopectate Imodium A-D  *NO pepto Bismol  Hemorrhoids: Anusol Anusol HC Preparation  H Tucks  Indigestion: Tums Maalox Mylanta Zantac  Pepcid  Insomnia: Benadryl (alcohol free) 25mg  every 6 hours as needed Tylenol PM Unisom, no Gelcaps  Leg Cramps: Tums MagGel  Nausea/Vomiting:  Bonine Dramamine Emetrol Ginger extract Sea bands Meclizine  Nausea medication to take during pregnancy:  Unisom (doxylamine succinate 25 mg tablets) Take one tablet daily at bedtime. If symptoms are not adequately controlled, the dose can be increased to a maximum recommended dose of two tablets daily (1/2 tablet in the morning, 1/2 tablet mid-afternoon and one at bedtime). Vitamin B6 100mg  tablets. Take one tablet twice a day (up to 200 mg per day).  Skin Rashes: Aveeno products Benadryl cream or 25mg  every 6 hours as needed Calamine Lotion 1% cortisone cream  Yeast infection: Gyne-lotrimin 7 Monistat 7  Gum/tooth pain: Anbesol  **If taking multiple medications, please check labels to avoid duplicating the same active ingredients **take medication as directed on the label ** Do not exceed 4000 mg of tylenol in 24 hours **Do not take medications that contain aspirin or ibuprofen    Contraception Choices Contraception (birth control) is the use of any methods or devices to prevent pregnancy. Below are some methods to help avoid pregnancy. HORMONAL METHODS   Contraceptive implant. This is a thin, plastic tube containing progesterone hormone. It does not contain estrogen hormone. Your health care provider inserts the tube in the inner part of the upper arm. The tube can remain in place for up to 3 years. After 3 years, the implant must be removed. The implant prevents the ovaries from releasing an egg (ovulation), thickens the cervical mucus to prevent sperm from entering the uterus, and thins the lining of the inside of the uterus.  Progesterone-only injections. These injections are given every 3 months by your health care provider to prevent pregnancy. This synthetic  progesterone hormone stops the ovaries from releasing eggs. It also thickens cervical mucus and changes the uterine lining. This makes it harder for sperm to survive in the uterus.  Birth control pills. These pills contain estrogen and progesterone hormone. They work by preventing the ovaries from releasing eggs (ovulation). They also cause the cervical mucus to thicken, preventing the sperm from entering the uterus. Birth control pills are prescribed by a health care provider.Birth control pills can also be used to treat heavy periods.  Minipill. This type of birth control pill contains only the progesterone hormone. They are taken every day of each month and must be prescribed by your health care provider.  Birth control patch. The patch contains hormones similar to those in birth control pills. It must be changed once a week and is prescribed by a health care provider.  Vaginal ring. The ring contains hormones similar to  those in birth control pills. It is left in the vagina for 3 weeks, removed for 1 week, and then a new one is put back in place. The patient must be comfortable inserting and removing the ring from the vagina.A health care provider's prescription is necessary.  Emergency contraception. Emergency contraceptives prevent pregnancy after unprotected sexual intercourse. This pill can be taken right after sex or up to 5 days after unprotected sex. It is most effective the sooner you take the pills after having sexual intercourse. Most emergency contraceptive pills are available without a prescription. Check with your pharmacist. Do not use emergency contraception as your only form of birth control. BARRIER METHODS   Female condom. This is a thin sheath (latex or rubber) that is worn over the penis during sexual intercourse. It can be used with spermicide to increase effectiveness.  Female condom. This is a soft, loose-fitting sheath that is put into the vagina before sexual  intercourse.  Diaphragm. This is a soft, latex, dome-shaped barrier that must be fitted by a health care provider. It is inserted into the vagina, along with a spermicidal jelly. It is inserted before intercourse. The diaphragm should be left in the vagina for 6 to 8 hours after intercourse.  Cervical cap. This is a round, soft, latex or plastic cup that fits over the cervix and must be fitted by a health care provider. The cap can be left in place for up to 48 hours after intercourse.  Sponge. This is a soft, circular piece of polyurethane foam. The sponge has spermicide in it. It is inserted into the vagina after wetting it and before sexual intercourse.  Spermicides. These are chemicals that kill or block sperm from entering the cervix and uterus. They come in the form of creams, jellies, suppositories, foam, or tablets. They do not require a prescription. They are inserted into the vagina with an applicator before having sexual intercourse. The process must be repeated every time you have sexual intercourse. INTRAUTERINE CONTRACEPTION  Intrauterine device (IUD). This is a T-shaped device that is put in a woman's uterus during a menstrual period to prevent pregnancy. There are 2 types:  Copper IUD. This type of IUD is wrapped in copper wire and is placed inside the uterus. Copper makes the uterus and fallopian tubes produce a fluid that kills sperm. It can stay in place for 10 years.  Hormone IUD. This type of IUD contains the hormone progestin (synthetic progesterone). The hormone thickens the cervical mucus and prevents sperm from entering the uterus, and it also thins the uterine lining to prevent implantation of a fertilized egg. The hormone can weaken or kill the sperm that get into the uterus. It can stay in place for 3-5 years, depending on which type of IUD is used. PERMANENT METHODS OF CONTRACEPTION  Female tubal ligation. This is when the woman's fallopian tubes are surgically sealed,  tied, or blocked to prevent the egg from traveling to the uterus.  Hysteroscopic sterilization. This involves placing a small coil or insert into each fallopian tube. Your doctor uses a technique called hysteroscopy to do the procedure. The device causes scar tissue to form. This results in permanent blockage of the fallopian tubes, so the sperm cannot fertilize the egg. It takes about 3 months after the procedure for the tubes to become blocked. You must use another form of birth control for these 3 months.  Female sterilization. This is when the female has the tubes that carry sperm tied off (  vasectomy).This blocks sperm from entering the vagina during sexual intercourse. After the procedure, the man can still ejaculate fluid (semen). NATURAL PLANNING METHODS  Natural family planning. This is not having sexual intercourse or using a barrier method (condom, diaphragm, cervical cap) on days the woman could become pregnant.  Calendar method. This is keeping track of the length of each menstrual cycle and identifying when you are fertile.  Ovulation method. This is avoiding sexual intercourse during ovulation.  Symptothermal method. This is avoiding sexual intercourse during ovulation, using a thermometer and ovulation symptoms.  Post-ovulation method. This is timing sexual intercourse after you have ovulated. Regardless of which type or method of contraception you choose, it is important that you use condoms to protect against the transmission of sexually transmitted infections (STIs). Talk with your health care provider about which form of contraception is most appropriate for you.   This information is not intended to replace advice given to you by your health care provider. Make sure you discuss any questions you have with your health care provider.   Document Released: 05/19/2005 Document Revised: 05/24/2013 Document Reviewed: 11/11/2012 Elsevier Interactive Patient Education [employer].

## 2015-10-03 NOTE — Progress Notes (Signed)
Subjective:  Rachel Ritter is a 25 y.o. G1P0 at 5739w2d being seen today for ongoing prenatal care.  She is currently monitored for the following issues for this low-risk pregnancy and has Supervision of normal first pregnancy in second trimester and Obesity during pregnancy in second trimester on her problem list.  Patient reports no complaints.  Contractions: Not present.  .  Movement: Absent. Denies leaking of fluid.   The following portions of the patient's history were reviewed and updated as appropriate: allergies, current medications, past family history, past medical history, past social history, past surgical history and problem list. Problem list updated.  Objective:   Filed Vitals:   10/03/15 1035  BP: 114/89  Pulse: 86  Weight: 236 lb 12.8 oz (107.412 kg)    Fetal Status: Fetal Heart Rate (bpm): 156   Movement: Absent     General:  Alert, oriented and cooperative. Patient is in no acute distress.  Skin: Skin is warm and dry. No rash noted.   Cardiovascular: Normal heart rate noted  Respiratory: Normal respiratory effort, no problems with respiration noted  Abdomen: Soft, gravid, appropriate for gestational age. Pain/Pressure: Absent     Pelvic:       Cervical exam deferred        Extremities: Normal range of motion.  Edema: None  Mental Status: Normal mood and affect. Normal behavior. Normal judgment and thought content.   Urinalysis:      Assessment and Plan:  Pregnancy: G1P0 at 4139w2d  1. Supervision of normal first pregnancy in second trimester -anatomy scan scheduled  2. Obesity during pregnancy in second trimester   Preterm labor symptoms and general obstetric precautions including but not limited to vaginal bleeding, contractions, leaking of fluid and fetal movement were reviewed in detail with the patient. Please refer to After Visit Summary for other counseling recommendations.  Return in about 4 weeks (around 10/31/2015) for Routine OB.   Judeth HornErin Fitzroy Mikami,  NP

## 2015-10-12 ENCOUNTER — Other Ambulatory Visit: Payer: Self-pay | Admitting: Certified Nurse Midwife

## 2015-10-12 ENCOUNTER — Ambulatory Visit (HOSPITAL_COMMUNITY)
Admission: RE | Admit: 2015-10-12 | Discharge: 2015-10-12 | Disposition: A | Discharge status: Home / Self Care | Payer: Medicaid Other | Source: Ambulatory Visit | Attending: Certified Nurse Midwife | Admitting: Certified Nurse Midwife

## 2015-10-12 DIAGNOSIS — Z3689 Encounter for other specified antenatal screening: Secondary | ICD-10-CM

## 2015-10-12 DIAGNOSIS — O99212 Obesity complicating pregnancy, second trimester: Secondary | ICD-10-CM

## 2015-10-12 DIAGNOSIS — Z3A19 19 weeks gestation of pregnancy: Secondary | ICD-10-CM

## 2015-10-12 DIAGNOSIS — Z36 Encounter for antenatal screening of mother: Secondary | ICD-10-CM | POA: Diagnosis present

## 2015-10-12 DIAGNOSIS — Z363 Encounter for antenatal screening for malformations: Secondary | ICD-10-CM

## 2015-10-12 DIAGNOSIS — Z1389 Encounter for screening for other disorder: Secondary | ICD-10-CM

## 2015-10-12 DIAGNOSIS — Z3402 Encounter for supervision of normal first pregnancy, second trimester: Secondary | ICD-10-CM

## 2015-10-31 ENCOUNTER — Ambulatory Visit (INDEPENDENT_AMBULATORY_CARE_PROVIDER_SITE_OTHER): Payer: Medicaid Other | Admitting: Medical

## 2015-10-31 ENCOUNTER — Encounter: Payer: Self-pay | Admitting: Medical

## 2015-10-31 VITALS — BP 118/78 | HR 85 | Wt 236.3 lb

## 2015-10-31 DIAGNOSIS — E669 Obesity, unspecified: Secondary | ICD-10-CM

## 2015-10-31 DIAGNOSIS — O99212 Obesity complicating pregnancy, second trimester: Secondary | ICD-10-CM | POA: Diagnosis not present

## 2015-10-31 DIAGNOSIS — Z3402 Encounter for supervision of normal first pregnancy, second trimester: Secondary | ICD-10-CM

## 2015-10-31 LAB — POCT URINALYSIS DIP (DEVICE)
BILIRUBIN URINE: NEGATIVE
GLUCOSE, UA: NEGATIVE mg/dL
HGB URINE DIPSTICK: NEGATIVE
Ketones, ur: NEGATIVE mg/dL
Nitrite: NEGATIVE
Protein, ur: NEGATIVE mg/dL
SPECIFIC GRAVITY, URINE: 1.015 (ref 1.005–1.030)
Urobilinogen, UA: 0.2 mg/dL (ref 0.0–1.0)
pH: 7 (ref 5.0–8.0)

## 2015-10-31 NOTE — Progress Notes (Signed)
Subjective:  Rachel Ritter is a 25 y.o. G1P0 at 5757w2d being seen today for ongoing prenatal care.  She is currently monitored for the following issues for this low-risk pregnancy and has Supervision of normal first pregnancy in second trimester and Obesity during pregnancy in second trimester on her problem list.  Patient reports round ligament pain.  Contractions: Not present. Vag. Bleeding: None.  Movement: Absent. Denies leaking of fluid.   The following portions of the patient's history were reviewed and updated as appropriate: allergies, current medications, past family history, past medical history, past social history, past surgical history and problem list. Problem list updated.  Objective:   Filed Vitals:   10/31/15 1055  BP: 118/78  Pulse: 85  Weight: 236 lb 4.8 oz (107.185 kg)    Fetal Status: Fetal Heart Rate (bpm): 140 Fundal Height: 24 cm Movement: Absent     General:  Alert, oriented and cooperative. Patient is in no acute distress.  Skin: Skin is warm and dry. No rash noted.   Cardiovascular: Normal heart rate noted  Respiratory: Normal respiratory effort, no problems with respiration noted  Abdomen: Soft, gravid, appropriate for gestational age. Pain/Pressure: Present     Pelvic: Vag. Bleeding: None     Cervical exam deferred        Extremities: Normal range of motion.  Edema: Trace  Mental Status: Normal mood and affect. Normal behavior. Normal judgment and thought content.   Urinalysis: Urine Protein: Negative Urine Glucose: Negative  Assessment and Plan:  Pregnancy: G1P0 at 6157w2d  1. Supervision of normal first pregnancy in second trimester - Note given with work restrictions at patient's request due to fatigue and soreness - Follow-up US ordered for growth per MFM recommendation  2. Obesity - US MFM OB FOLLOW UP; Future  Preterm labor symptoms and general obstetric precautions including but not limited to vaginal bleeding, contractions, leaking of fluid  and fetal movement were reviewed in detail with the patient. Please refer to After Visit Summary for other counseling recommendations.  Return in about 4 weeks (around 11/28/2015) for ROB/GTT.   Marny LowensteinJulie N Wenzel, PA-C

## 2015-10-31 NOTE — Patient Instructions (Signed)
Second Trimester of Pregnancy  The second trimester is from week 13 through week 28, month 4 through 6. This is often the time in pregnancy that you feel your best. Often times, morning sickness has lessened or quit. You may have more energy, and you may get hungry more often. Your unborn baby (fetus) is growing rapidly. At the end of the sixth month, he or she is about 9 inches long and weighs about 1½ pounds. You will likely feel the baby move (quickening) between 18 and 20 weeks of pregnancy.  HOME CARE   · Avoid all smoking, herbs, and alcohol. Avoid drugs not approved by your doctor.  · Do not use any tobacco products, including cigarettes, chewing tobacco, and electronic cigarettes. If you need help quitting, ask your doctor. You may get counseling or other support to help you quit.  · Only take medicine as told by your doctor. Some medicines are safe and some are not during pregnancy.  · Exercise only as told by your doctor. Stop exercising if you start having cramps.  · Eat regular, healthy meals.  · Wear a good support bra if your breasts are tender.  · Do not use hot tubs, steam rooms, or saunas.  · Wear your seat belt when driving.  · Avoid raw meat, uncooked cheese, and liter boxes and soil used by cats.  · Take your prenatal vitamins.  · Take 1500-2000 milligrams of calcium daily starting at the 20th week of pregnancy until you deliver your baby.  · Try taking medicine that helps you poop (stool softener) as needed, and if your doctor approves. Eat more fiber by eating fresh fruit, vegetables, and whole grains. Drink enough fluids to keep your pee (urine) clear or pale yellow.  · Take warm water baths (sitz baths) to soothe pain or discomfort caused by hemorrhoids. Use hemorrhoid cream if your doctor approves.  · If you have puffy, bulging veins (varicose veins), wear support hose. Raise (elevate) your feet for 15 minutes, 3-4 times a day. Limit salt in your diet.  · Avoid heavy lifting, wear low heals,  and sit up straight.  · Rest with your legs raised if you have leg cramps or low back pain.  · Visit your dentist if you have not gone during your pregnancy. Use a soft toothbrush to brush your teeth. Be gentle when you floss.  · You can have sex (intercourse) unless your doctor tells you not to.  · Go to your doctor visits.  GET HELP IF:   · You feel dizzy.  · You have mild cramps or pressure in your lower belly (abdomen).  · You have a nagging pain in your belly area.  · You continue to feel sick to your stomach (nauseous), throw up (vomit), or have watery poop (diarrhea).  · You have bad smelling fluid coming from your vagina.  · You have pain with peeing (urination).  GET HELP RIGHT AWAY IF:   · You have a fever.  · You are leaking fluid from your vagina.  · You have spotting or bleeding from your vagina.  · You have severe belly cramping or pain.  · You lose or gain weight rapidly.  · You have trouble catching your breath and have chest pain.  · You notice sudden or extreme puffiness (swelling) of your face, hands, ankles, feet, or legs.  · You have not felt the baby move in over an hour.  · You have severe headaches that do   not go away with medicine.  · You have vision changes.     This information is not intended to replace advice given to you by your health care provider. Make sure you discuss any questions you have with your health care provider.     Document Released: 08/13/2009 Document Revised: 06/09/2014 Document Reviewed: 07/20/2012  Elsevier Interactive Patient Education ©2016 [employer]

## 2015-11-28 ENCOUNTER — Ambulatory Visit (INDEPENDENT_AMBULATORY_CARE_PROVIDER_SITE_OTHER): Payer: Medicaid Other | Admitting: Obstetrics and Gynecology

## 2015-11-28 VITALS — BP 134/76 | HR 72 | Wt 236.4 lb

## 2015-11-28 DIAGNOSIS — O99212 Obesity complicating pregnancy, second trimester: Secondary | ICD-10-CM

## 2015-11-28 DIAGNOSIS — E669 Obesity, unspecified: Secondary | ICD-10-CM

## 2015-11-28 DIAGNOSIS — Z3402 Encounter for supervision of normal first pregnancy, second trimester: Secondary | ICD-10-CM | POA: Diagnosis present

## 2015-11-28 DIAGNOSIS — Z23 Encounter for immunization: Secondary | ICD-10-CM

## 2015-11-28 LAB — CBC
HCT: 34.8 % — ABNORMAL LOW (ref 35.0–45.0)
Hemoglobin: 11.4 g/dL — ABNORMAL LOW (ref 11.7–15.5)
MCH: 28.6 pg (ref 27.0–33.0)
MCHC: 32.8 g/dL (ref 32.0–36.0)
MCV: 87.2 fL (ref 80.0–100.0)
MPV: 8.8 fL (ref 7.5–12.5)
PLATELETS: 274 10*3/uL (ref 140–400)
RBC: 3.99 MIL/uL (ref 3.80–5.10)
RDW: 14.6 % (ref 11.0–15.0)
WBC: 8.8 10*3/uL (ref 3.8–10.8)

## 2015-11-28 MED ORDER — TETANUS-DIPHTH-ACELL PERTUSSIS 5-2.5-18.5 LF-MCG/0.5 IM SUSP
0.5000 mL | Freq: Once | INTRAMUSCULAR | Status: AC
  Administered 2015-11-28: 0.5 mL via INTRAMUSCULAR

## 2015-11-28 NOTE — Progress Notes (Signed)
Subjective:  Rachel Ritter is a 25 y.o. G1P0 at 3445w2d being seen today for ongoing prenatal care.  She is currently monitored for the following issues for this low-risk pregnancy and has Supervision of normal first pregnancy in second trimester and Obesity during pregnancy in second trimester on her problem list.  Patient reports no complaints.   Contractions: Not present. Vag. Bleeding: None.  Movement: Present. Denies leaking of fluid.   The following portions of the patient's history were reviewed and updated as appropriate: allergies, current medications, past family history, past medical history, past social history, past surgical history and problem list. Problem list updated.  Objective:   Filed Vitals:   11/28/15 1405  BP: 134/76  Pulse: 72  Weight: 236 lb 6.4 oz (107.23 kg)    Fetal Status: Fetal Heart Rate (bpm): 156   Movement: Present     General:  Alert, oriented and cooperative. Patient is in no acute distress.  Skin: Skin is warm and dry. No rash noted.   Cardiovascular: Normal heart rate noted  Respiratory: Normal respiratory effort, no problems with respiration noted  Abdomen: Soft, gravid, appropriate for gestational age. Pain/Pressure: Absent     Pelvic:  Cervical exam deferred        Extremities: Normal range of motion.     Mental Status: Normal mood and affect. Normal behavior. Normal judgment and thought content.   Urinalysis:      Assessment and Plan:  Pregnancy: G1P0 at 4245w2d  1. Need for Tdap vaccination - Glucose Tolerance, 1 HR (50g) w/o Fasting - CBC - RPR - HIV antibody (with reflex) - Tdap (BOOSTRIX) injection 0.5 mL; Inject 0.5 mLs into the muscle once.  2. Supervision of normal first pregnancy in second trimester See above   Preterm labor symptoms and general obstetric precautions including but not limited to vaginal bleeding, contractions, leaking of fluid and fetal movement were reviewed in detail with the patient. Please refer to  After Visit Summary for other counseling recommendations.  Return in about 3 weeks (around 12/19/2015).   [facility] Varone, MD

## 2015-11-28 NOTE — Progress Notes (Signed)
TDAP GIVEN TODAY  1 HOUR GTT DUE 3:07

## 2015-11-29 LAB — HIV ANTIBODY (ROUTINE TESTING W REFLEX): HIV 1&2 Ab, 4th Generation: NONREACTIVE

## 2015-11-29 LAB — GLUCOSE TOLERANCE, 1 HOUR (50G) W/O FASTING: Glucose, 1 Hr, gestational: 105 mg/dL (ref <140)

## 2015-11-29 LAB — RPR

## 2015-12-14 ENCOUNTER — Ambulatory Visit (HOSPITAL_COMMUNITY): Payer: Medicaid Other

## 2015-12-21 ENCOUNTER — Ambulatory Visit (HOSPITAL_COMMUNITY)
Admission: RE | Admit: 2015-12-21 | Discharge: 2015-12-21 | Disposition: A | Discharge status: Home / Self Care | Payer: Medicaid Other | Source: Ambulatory Visit | Attending: Medical | Admitting: Medical

## 2015-12-21 ENCOUNTER — Other Ambulatory Visit: Payer: Self-pay | Admitting: Medical

## 2015-12-21 DIAGNOSIS — E669 Obesity, unspecified: Secondary | ICD-10-CM | POA: Diagnosis present

## 2015-12-21 DIAGNOSIS — O99213 Obesity complicating pregnancy, third trimester: Secondary | ICD-10-CM

## 2015-12-21 DIAGNOSIS — IMO0002 Reserved for concepts with insufficient information to code with codable children: Secondary | ICD-10-CM

## 2015-12-21 DIAGNOSIS — Z3A29 29 weeks gestation of pregnancy: Secondary | ICD-10-CM

## 2015-12-21 DIAGNOSIS — Z3402 Encounter for supervision of normal first pregnancy, second trimester: Secondary | ICD-10-CM

## 2015-12-21 DIAGNOSIS — Z0489 Encounter for examination and observation for other specified reasons: Secondary | ICD-10-CM

## 2015-12-21 DIAGNOSIS — Z36 Encounter for antenatal screening of mother: Secondary | ICD-10-CM | POA: Insufficient documentation

## 2015-12-26 ENCOUNTER — Ambulatory Visit (INDEPENDENT_AMBULATORY_CARE_PROVIDER_SITE_OTHER): Payer: Medicaid Other | Admitting: Obstetrics and Gynecology

## 2015-12-26 VITALS — BP 122/48 | HR 110 | Wt 235.1 lb

## 2015-12-26 DIAGNOSIS — Z3402 Encounter for supervision of normal first pregnancy, second trimester: Secondary | ICD-10-CM

## 2015-12-26 LAB — POCT URINALYSIS DIP (DEVICE)
BILIRUBIN URINE: NEGATIVE
Glucose, UA: NEGATIVE mg/dL
HGB URINE DIPSTICK: NEGATIVE
KETONES UR: NEGATIVE mg/dL
Leukocytes, UA: NEGATIVE
Nitrite: NEGATIVE
PH: 7 (ref 5.0–8.0)
PROTEIN: NEGATIVE mg/dL
SPECIFIC GRAVITY, URINE: 1.015 (ref 1.005–1.030)
Urobilinogen, UA: 0.2 mg/dL (ref 0.0–1.0)

## 2015-12-26 NOTE — Progress Notes (Signed)
Subjective:  Rachel Ritter is a 25 y.o. G1P0 at [redacted]w[redacted]d being seen today for ongoing prenatal care.  She is currently monitored for the following issues for this low-risk pregnancy and has Supervision of normal first pregnancy in second trimester and Obesity during pregnancy in second trimester on her problem list.  Patient reports no complaints.  Contractions: Not present.  .  Movement: Present. Denies leaking of fluid.   The following portions of the patient's history were reviewed and updated as appropriate: allergies, current medications, past family history, past medical history, past social history, past surgical history and problem list. Problem list updated.  Objective:   Vitals:   12/26/15 1001  BP: (!) 122/48  Pulse: (!) 110  Weight: 235 lb 1.6 oz (106.6 kg)    Fetal Status: Fetal Heart Rate (bpm): 145   Movement: Present     General:  Alert, oriented and cooperative. Patient is in no acute distress.  Skin: Skin is warm and dry. No rash noted.   Cardiovascular: Normal heart rate noted  Respiratory: Normal respiratory effort, no problems with respiration noted  Abdomen: Soft, gravid, appropriate for gestational age. Pain/Pressure: Absent     Pelvic:  Cervical exam deferred        Extremities: Normal range of motion.  Edema: Trace  Mental Status: Normal mood and affect. Normal behavior. Normal judgment and thought content.   Urinalysis:      Assessment and Plan:  Pregnancy: G1P0 at [redacted]w[redacted]d  1. Supervision of normal first pregnancy in second trimester No problems, routine care  Preterm labor symptoms and general obstetric precautions including but not limited to vaginal bleeding, contractions, leaking of fluid and fetal movement were reviewed in detail with the patient. Please refer to After Visit Summary for other counseling recommendations.  Return in about 2 weeks (around 01/09/2016) for LOB.   Lorne Skeens, MD

## 2015-12-26 NOTE — Patient Instructions (Addendum)
Places to have your son circumcised:    [facility] [phone] $480 by 4 wks  [facility] [phone] $244 by 4 wks  [facility] [phone] $175 by 2 wks  [facility] [phone] $250 by 7 days [facility] [phone] $150 by 4 wks  These prices sometimes change but are roughly what you can expect to pay. Please call and confirm pricing.   Circumcision is considered an elective/non-medically necessary procedure. There are many reasons parents decide to have their sons circumsized. During the first year of life circumcised males have a reduced risk of urinary tract infections but after this year the rates between circumcised males and uncircumcised males are the same.  It is safe to have your son circumcised outside of the hospital and the places above perform them regularly.    Breastfeeding Deciding to breastfeed is one of the best choices you can make for you and your baby. A change in hormones during pregnancy causes your breast tissue to grow and increases the number and size of your milk ducts. These hormones also allow proteins, sugars, and fats from your blood supply to make breast milk in your milk-producing glands. Hormones prevent breast milk from being released before your baby is born as well as prompt milk flow after birth. Once breastfeeding has begun, thoughts of your baby, as well as his or her sucking or crying, can stimulate the release of milk from your milk-producing glands.  BENEFITS OF BREASTFEEDING For Your Baby  Your first milk (colostrum) helps your baby's digestive system function better.  There are antibodies in your milk that help your baby fight off infections.  Your baby has a lower incidence of asthma, allergies, and sudden infant death syndrome.  The nutrients in breast milk are better for your baby  than infant formulas and are designed uniquely for your baby's needs.  Breast milk improves your baby's brain development.  Your baby is less likely to develop other conditions, such as childhood obesity, asthma, or type 2 diabetes mellitus. For You  Breastfeeding helps to create a very special bond between you and your baby.  Breastfeeding is convenient. Breast milk is always available at the correct temperature and costs nothing.  Breastfeeding helps to burn calories and helps you lose the weight gained during pregnancy.  Breastfeeding makes your uterus contract to its prepregnancy size faster and slows bleeding (lochia) after you give birth.   Breastfeeding helps to lower your risk of developing type 2 diabetes mellitus, osteoporosis, and breast or ovarian cancer later in life. SIGNS THAT YOUR BABY IS HUNGRY Early Signs of Hunger  Increased alertness or activity.  Stretching.  Movement of the head from side to side.  Movement of the head and opening of the mouth when the corner of the mouth or cheek is stroked (rooting).  Increased sucking sounds, smacking lips, cooing, sighing, or squeaking.  Hand-to-mouth movements.  Increased sucking of fingers or hands. Late Signs of Hunger  Fussing.  Intermittent crying. Extreme Signs of Hunger Signs of extreme hunger will require calming and consoling before your baby will be able to breastfeed successfully. Do not wait for the following signs of extreme hunger to occur before you initiate breastfeeding:  Restlessness.  A loud, strong cry.  Screaming. BREASTFEEDING BASICS Breastfeeding Initiation  Find a comfortable place to sit or lie down, with your neck and back well supported.  Place a pillow or rolled up blanket under your baby to bring him or her to the level of your breast (if you  are seated). Nursing pillows are specially designed to help support your arms and your baby while you breastfeed.  Make sure that your  baby's abdomen is facing your abdomen.  Gently massage your breast. With your fingertips, massage from your chest wall toward your nipple in a circular motion. This encourages milk flow. You may need to continue this action during the feeding if your milk flows slowly.  Support your breast with 4 fingers underneath and your thumb above your nipple. Make sure your fingers are well away from your nipple and your baby's mouth.  Stroke your baby's lips gently with your finger or nipple.  When your baby's mouth is open wide enough, quickly bring your baby to your breast, placing your entire nipple and as much of the colored area around your nipple (areola) as possible into your baby's mouth.  More areola should be visible above your baby's upper lip than below the lower lip.  Your baby's tongue should be between his or her lower gum and your breast.  Ensure that your baby's mouth is correctly positioned around your nipple (latched). Your baby's lips should create a seal on your breast and be turned out (everted).  It is common for your baby to suck about 2-3 minutes in order to start the flow of breast milk. Latching Teaching your baby how to latch on to your breast properly is very important. An improper latch can cause nipple pain and decreased milk supply for you and poor weight gain in your baby. Also, if your baby is not latched onto your nipple properly, he or she may swallow some air during feeding. This can make your baby fussy. Burping your baby when you switch breasts during the feeding can help to get rid of the air. However, teaching your baby to latch on properly is still the best way to prevent fussiness from swallowing air while breastfeeding. Signs that your baby has successfully latched on to your nipple:  Silent tugging or silent sucking, without causing you pain.  Swallowing heard between every 3-4 sucks.  Muscle movement above and in front of his or her ears while  sucking. Signs that your baby has not successfully latched on to nipple:  Sucking sounds or smacking sounds from your baby while breastfeeding.  Nipple pain. If you think your baby has not latched on correctly, slip your finger into the corner of your baby's mouth to break the suction and place it between your baby's gums. Attempt breastfeeding initiation again. Signs of Successful Breastfeeding Signs from your baby:  A gradual decrease in the number of sucks or complete cessation of sucking.  Falling asleep.  Relaxation of his or her body.  Retention of a small amount of milk in his or her mouth.  Letting go of your breast by himself or herself. Signs from you:  Breasts that have increased in firmness, weight, and size 1-3 hours after feeding.  Breasts that are softer immediately after breastfeeding.  Increased milk volume, as well as a change in milk consistency and color by the fifth day of breastfeeding.  Nipples that are not sore, cracked, or bleeding. Signs That Your Pecola Leisure is Getting Enough Milk  Wetting at least 3 diapers in a 24-hour period. The urine should be clear and pale yellow by age 28 days.  At least 3 stools in a 24-hour period by age 28 days. The stool should be soft and yellow.  At least 3 stools in a 24-hour period by age 65 days.  The stool should be seedy and yellow.  No loss of weight greater than 10% of birth weight during the first 55 days of age.  Average weight gain of 4-7 ounces (113-198 g) per week after age 94 days.  Consistent daily weight gain by age 76 days, without weight loss after the age of 2 weeks. After a feeding, your baby may spit up a small amount. This is common. BREASTFEEDING FREQUENCY AND DURATION Frequent feeding will help you make more milk and can prevent sore nipples and breast engorgement. Breastfeed when you feel the need to reduce the fullness of your breasts or when your baby shows signs of hunger. This is called "breastfeeding  on demand." Avoid introducing a pacifier to your baby while you are working to establish breastfeeding (the first 4-6 weeks after your baby is born). After this time you may choose to use a pacifier. Research has shown that pacifier use during the first year of a baby's life decreases the risk of sudden infant death syndrome (SIDS). Allow your baby to feed on each breast as long as he or she wants. Breastfeed until your baby is finished feeding. When your baby unlatches or falls asleep while feeding from the first breast, offer the second breast. Because newborns are often sleepy in the first few weeks of life, you may need to awaken your baby to get him or her to feed. Breastfeeding times will vary from baby to baby. However, the following rules can serve as a guide to help you ensure that your baby is properly fed:  Newborns (babies 25 weeks of age or younger) may breastfeed every 1-3 hours.  Newborns should not go longer than 3 hours during the day or 5 hours during the night without breastfeeding.  You should breastfeed your baby a minimum of 8 times in a 24-hour period until you begin to introduce solid foods to your baby at around 75 months of age. BREAST MILK PUMPING Pumping and storing breast milk allows you to ensure that your baby is exclusively fed your breast milk, even at times when you are unable to breastfeed. This is especially important if you are going back to work while you are still breastfeeding or when you are not able to be present during feedings. Your lactation consultant can give you guidelines on how long it is safe to store breast milk. A breast pump is a machine that allows you to pump milk from your breast into a sterile bottle. The pumped breast milk can then be stored in a refrigerator or freezer. Some breast pumps are operated by hand, while others use electricity. Ask your lactation consultant which type will work best for you. Breast pumps can be purchased, but some  hospitals and breastfeeding support groups lease breast pumps on a monthly basis. A lactation consultant can teach you how to hand express breast milk, if you prefer not to use a pump. CARING FOR YOUR BREASTS WHILE YOU BREASTFEED Nipples can become dry, cracked, and sore while breastfeeding. The following recommendations can help keep your breasts moisturized and healthy:  Avoid using soap on your nipples.  Wear a supportive bra. Although not required, special nursing bras and tank tops are designed to allow access to your breasts for breastfeeding without taking off your entire bra or top. Avoid wearing underwire-style bras or extremely tight bras.  Air dry your nipples for 3-90minutes after each feeding.  Use only cotton bra pads to absorb leaked breast milk. Leaking of breast milk  between feedings is normal.  Use lanolin on your nipples after breastfeeding. Lanolin helps to maintain your skin's normal moisture barrier. If you use pure lanolin, you do not need to wash it off before feeding your baby again. Pure lanolin is not toxic to your baby. You may also hand express a few drops of breast milk and gently massage that milk into your nipples and allow the milk to air dry. In the first few weeks after giving birth, some women experience extremely full breasts (engorgement). Engorgement can make your breasts feel heavy, warm, and tender to the touch. Engorgement peaks within 3-5 days after you give birth. The following recommendations can help ease engorgement:  Completely empty your breasts while breastfeeding or pumping. You may want to start by applying warm, moist heat (in the shower or with warm water-soaked hand towels) just before feeding or pumping. This increases circulation and helps the milk flow. If your baby does not completely empty your breasts while breastfeeding, pump any extra milk after he or she is finished.  Wear a snug bra (nursing or regular) or tank top for 1-2 days to  signal your body to slightly decrease milk production.  Apply ice packs to your breasts, unless this is too uncomfortable for you.  Make sure that your baby is latched on and positioned properly while breastfeeding. If engorgement persists after 48 hours of following these recommendations, contact your health care provider or a Advertising copywriter. OVERALL HEALTH CARE RECOMMENDATIONS WHILE BREASTFEEDING  Eat healthy foods. Alternate between meals and snacks, eating 3 of each per day. Because what you eat affects your breast milk, some of the foods may make your baby more irritable than usual. Avoid eating these foods if you are sure that they are negatively affecting your baby.  Drink milk, fruit juice, and water to satisfy your thirst (about 10 glasses a day).  Rest often, relax, and continue to take your prenatal vitamins to prevent fatigue, stress, and anemia.  Continue breast self-awareness checks.  Avoid chewing and smoking tobacco. Chemicals from cigarettes that pass into breast milk and exposure to secondhand smoke may harm your baby.  Avoid alcohol and drug use, including marijuana. Some medicines that may be harmful to your baby can pass through breast milk. It is important to ask your health care provider before taking any medicine, including all over-the-counter and prescription medicine as well as vitamin and herbal supplements. It is possible to become pregnant while breastfeeding. If birth control is desired, ask your health care provider about options that will be safe for your baby. SEEK MEDICAL CARE IF:  You feel like you want to stop breastfeeding or have become frustrated with breastfeeding.  You have painful breasts or nipples.  Your nipples are cracked or bleeding.  Your breasts are red, tender, or warm.  You have a swollen area on either breast.  You have a fever or chills.  You have nausea or vomiting.  You have drainage other than breast milk from your  nipples.  Your breasts do not become full before feedings by the fifth day after you give birth.  You feel sad and depressed.  Your baby is too sleepy to eat well.  Your baby is having trouble sleeping.   Your baby is wetting less than 3 diapers in a 24-hour period.  Your baby has less than 3 stools in a 24-hour period.  Your baby's skin or the white part of his or her eyes becomes yellow.   Your  baby is not gaining weight by 7 days of age. SEEK IMMEDIATE MEDICAL CARE IF:  Your baby is overly tired (lethargic) and does not want to wake up and feed.  Your baby develops an unexplained fever.   This information is not intended to replace advice given to you by your health care provider. Make sure you discuss any questions you have with your health care provider.   Document Released: 05/19/2005 Document Revised: 02/07/2015 Document Reviewed: 11/10/2012 Elsevier Interactive Patient Education [employer]

## 2016-01-10 ENCOUNTER — Ambulatory Visit (INDEPENDENT_AMBULATORY_CARE_PROVIDER_SITE_OTHER): Payer: Medicaid Other | Admitting: Family Medicine

## 2016-01-10 VITALS — BP 117/69 | HR 99 | Wt 240.7 lb

## 2016-01-10 DIAGNOSIS — E669 Obesity, unspecified: Secondary | ICD-10-CM

## 2016-01-10 DIAGNOSIS — O99212 Obesity complicating pregnancy, second trimester: Secondary | ICD-10-CM

## 2016-01-10 DIAGNOSIS — Z3402 Encounter for supervision of normal first pregnancy, second trimester: Secondary | ICD-10-CM

## 2016-01-10 LAB — POCT URINALYSIS DIP (DEVICE)
BILIRUBIN URINE: NEGATIVE
Glucose, UA: NEGATIVE mg/dL
HGB URINE DIPSTICK: NEGATIVE
KETONES UR: NEGATIVE mg/dL
Leukocytes, UA: NEGATIVE
Nitrite: NEGATIVE
PH: 7 (ref 5.0–8.0)
Protein, ur: 30 mg/dL — AB
Specific Gravity, Urine: 1.02 (ref 1.005–1.030)
Urobilinogen, UA: 2 mg/dL — ABNORMAL HIGH (ref 0.0–1.0)

## 2016-01-10 NOTE — Progress Notes (Signed)
Subjective:  Rachel Ritter is a 25 y.o. G1P0 at 945w3d being seen today for ongoing prenatal care.  She is currently monitored for the following issues for this low-risk pregnancy and has Supervision of normal first pregnancy in second trimester and Obesity during pregnancy in second trimester on her problem list.  Patient reports no complaints.  Contractions: Not present.  .  Movement: Present. Denies leaking of fluid.   The following portions of the patient's history were reviewed and updated as appropriate: allergies, current medications, past family history, past medical history, past social history, past surgical history and problem list. Problem list updated.  Objective:   Vitals:   01/10/16 1243  BP: 117/69  Pulse: 99  Weight: 240 lb 11.2 oz (109.2 kg)    Fetal Status: Fetal Heart Rate (bpm): 134   Movement: Present      FHT:  134 BPM  Uterine Size: 33 cm  Presentation: cephalic  Cervical Exam: Did not perform  Cardio: RRR, no murmurs, rubs, gallops  Respiratory CTAB, no wheezing, rales, rhonchi  Extremities: Trace edema     Urinalysis:      Assessment and Plan:  Pregnancy: G1P0 at 8145w3d    1. Supervision of normal first pregnancy in second trimester  2. Obesity during pregnancy in second trimester  Preterm labor symptoms and general obstetric precautions including but not limited to vaginal bleeding, contractions, leaking of fluid and fetal movement were reviewed in detail with the patient. Please refer to After Visit Summary for other counseling recommendations.  Return in about 2 weeks (around 01/24/2016) for Routine OB visit.   [facility] Center[facility] Woodland TuronMumaw, [place]

## 2016-01-10 NOTE — Patient Instructions (Addendum)
Third Trimester of Pregnancy The third trimester is from week 29 through week 42, months 7 through 9. This trimester is when your unborn baby (fetus) is growing very fast. At the end of the ninth month, the unborn baby is about 20 inches in length. It weighs about 6-10 pounds.  HOME CARE   Avoid all smoking, herbs, and alcohol. Avoid drugs not approved by your doctor.  Do not use any tobacco products, including cigarettes, chewing tobacco, and electronic cigarettes. If you need help quitting, ask your doctor. You may get counseling or other support to help you quit.  Only take medicine as told by your doctor. Some medicines are safe and some are not during pregnancy.  Exercise only as told by your doctor. Stop exercising if you start having cramps.  Eat regular, healthy meals.  Wear a good support bra if your breasts are tender.  Do not use hot tubs, steam rooms, or saunas.  Wear your seat belt when driving.  Avoid raw meat, uncooked cheese, and liter boxes and soil used by cats.  Take your prenatal vitamins.  Take 1500-2000 milligrams of calcium daily starting at the 20th week of pregnancy until you deliver your baby.  Try taking medicine that helps you poop (stool softener) as needed, and if your doctor approves. Eat more fiber by eating fresh fruit, vegetables, and whole grains. Drink enough fluids to keep your pee (urine) clear or pale yellow.  Take warm water baths (sitz baths) to soothe pain or discomfort caused by hemorrhoids. Use hemorrhoid cream if your doctor approves.  If you have puffy, bulging veins (varicose veins), wear support hose. Raise (elevate) your feet for 15 minutes, 3-4 times a day. Limit salt in your diet.  Avoid heavy lifting, wear low heels, and sit up straight.  Rest with your legs raised if you have leg cramps or low back pain.  Visit your dentist if you have not gone during your pregnancy. Use a soft toothbrush to brush your teeth. Be gentle when you  floss.  You can have sex (intercourse) unless your doctor tells you not to.  Do not travel far distances unless you must. Only do so with your doctor's approval.  Take prenatal classes.  Practice driving to the hospital.  Pack your hospital bag.  Prepare the baby's room.  Go to your doctor visits. GET HELP IF:  You are not sure if you are in labor or if your water has broken.  You are dizzy.  You have mild cramps or pressure in your lower belly (abdominal).  You have a nagging pain in your belly area.  You continue to feel sick to your stomach (nauseous), throw up (vomit), or have watery poop (diarrhea).  You have bad smelling fluid coming from your vagina.  You have pain with peeing (urination). GET HELP RIGHT AWAY IF:   You have a fever.  You are leaking fluid from your vagina.  You are spotting or bleeding from your vagina.  You have severe belly cramping or pain.  You lose or gain weight rapidly.  You have trouble catching your breath and have chest pain.  You notice sudden or extreme puffiness (swelling) of your face, hands, ankles, feet, or legs.  You have not felt the baby move in over an hour.  You have severe headaches that do not go away with medicine.  You have vision changes.   This information is not intended to replace advice given to you by your health care   provider. Make sure you discuss any questions you have with your health care provider.   Document Released: 08/13/2009 Document Revised: 06/09/2014 Document Reviewed: 07/20/2012 Elsevier Interactive Patient Education 2016 [employer]  Fetal Movement Counts Patient Name: __________________________________________________ Patient Due Date: ____________________ Performing a fetal movement count is highly recommended in high-risk pregnancies, but it is good for every pregnant woman to do. Your health care provider may ask you to start counting fetal movements at 28 weeks of the pregnancy.  Fetal movements often increase:  After eating a full meal.  After physical activity.  After eating or drinking something sweet or cold.  At rest. Pay attention to when you feel the baby is most active. This will help you notice a pattern of your baby's sleep and wake cycles and what factors contribute to an increase in fetal movement. It is important to perform a fetal movement count at the same time each day when your baby is normally most active.  HOW TO COUNT FETAL MOVEMENTS 1. Find a quiet and comfortable area to sit or lie down on your left side. Lying on your left side provides the best blood and oxygen circulation to your baby. 2. Write down the day and time on a sheet of paper or in a journal. 3. Start counting kicks, flutters, swishes, rolls, or jabs in a 2-hour period. You should feel at least 10 movements within 2 hours. 4. If you do not feel 10 movements in 2 hours, wait 2-3 hours and count again. Look for a change in the pattern or not enough counts in 2 hours. SEEK MEDICAL CARE IF:  You feel less than 10 counts in 2 hours, tried twice.  There is no movement in over an hour.  The pattern is changing or taking longer each day to reach 10 counts in 2 hours.  You feel the baby is not moving as he or she usually does. Date: ____________ Movements: ____________ Start time: ____________ Doreatha MartinFinish time: ____________  Date: ____________ Movements: ____________ Start time: ____________ Doreatha MartinFinish time: ____________ Date: ____________ Movements: ____________ Start time: ____________ Doreatha MartinFinish time: ____________ Date: ____________ Movements: ____________ Start time: ____________ Doreatha MartinFinish time: ____________ Date: ____________ Movements: ____________ Start time: ____________ Doreatha MartinFinish time: ____________ Date: ____________ Movements: ____________ Start time: ____________ Doreatha MartinFinish time: ____________ Date: ____________ Movements: ____________ Start time: ____________ Doreatha MartinFinish time: ____________ Date:  ____________ Movements: ____________ Start time: ____________ Doreatha MartinFinish time: ____________  Date: ____________ Movements: ____________ Start time: ____________ Doreatha MartinFinish time: ____________ Date: ____________ Movements: ____________ Start time: ____________ Doreatha MartinFinish time: ____________ Date: ____________ Movements: ____________ Start time: ____________ Doreatha MartinFinish time: ____________ Date: ____________ Movements: ____________ Start time: ____________ Doreatha MartinFinish time: ____________ Date: ____________ Movements: ____________ Start time: ____________ Doreatha MartinFinish time: ____________ Date: ____________ Movements: ____________ Start time: ____________ Doreatha MartinFinish time: ____________ Date: ____________ Movements: ____________ Start time: ____________ Doreatha MartinFinish time: ____________  Date: ____________ Movements: ____________ Start time: ____________ Doreatha MartinFinish time: ____________ Date: ____________ Movements: ____________ Start time: ____________ Doreatha MartinFinish time: ____________ Date: ____________ Movements: ____________ Start time: ____________ Doreatha MartinFinish time: ____________ Date: ____________ Movements: ____________ Start time: ____________ Doreatha MartinFinish time: ____________ Date: ____________ Movements: ____________ Start time: ____________ Doreatha MartinFinish time: ____________ Date: ____________ Movements: ____________ Start time: ____________ Doreatha MartinFinish time: ____________ Date: ____________ Movements: ____________ Start time: ____________ Doreatha MartinFinish time: ____________  Date: ____________ Movements: ____________ Start time: ____________ Doreatha MartinFinish time: ____________ Date: ____________ Movements: ____________ Start time: ____________ Doreatha MartinFinish time: ____________ Date: ____________ Movements: ____________ Start time: ____________ Doreatha MartinFinish time: ____________ Date: ____________ Movements: ____________ Start time: ____________ Doreatha MartinFinish time: ____________ Date: ____________ Movements: ____________ Start time:  ____________ Doreatha Martin time: ____________ Date: ____________ Movements: ____________ Start  time: ____________ Doreatha Martin time: ____________ Date: ____________ Movements: ____________ Start time: ____________ Doreatha Martin time: ____________  Date: ____________ Movements: ____________ Start time: ____________ Doreatha Martin time: ____________ Date: ____________ Movements: ____________ Start time: ____________ Doreatha Martin time: ____________ Date: ____________ Movements: ____________ Start time: ____________ Doreatha Martin time: ____________ Date: ____________ Movements: ____________ Start time: ____________ Doreatha Martin time: ____________ Date: ____________ Movements: ____________ Start time: ____________ Doreatha Martin time: ____________ Date: ____________ Movements: ____________ Start time: ____________ Doreatha Martin time: ____________ Date: ____________ Movements: ____________ Start time: ____________ Doreatha Martin time: ____________  Date: ____________ Movements: ____________ Start time: ____________ Doreatha Martin time: ____________ Date: ____________ Movements: ____________ Start time: ____________ Doreatha Martin time: ____________ Date: ____________ Movements: ____________ Start time: ____________ Doreatha Martin time: ____________ Date: ____________ Movements: ____________ Start time: ____________ Doreatha Martin time: ____________ Date: ____________ Movements: ____________ Start time: ____________ Doreatha Martin time: ____________ Date: ____________ Movements: ____________ Start time: ____________ Doreatha Martin time: ____________ Date: ____________ Movements: ____________ Start time: ____________ Doreatha Martin time: ____________  Date: ____________ Movements: ____________ Start time: ____________ Doreatha Martin time: ____________ Date: ____________ Movements: ____________ Start time: ____________ Doreatha Martin time: ____________ Date: ____________ Movements: ____________ Start time: ____________ Doreatha Martin time: ____________ Date: ____________ Movements: ____________ Start time: ____________ Doreatha Martin time: ____________ Date: ____________ Movements: ____________ Start time: ____________ Doreatha Martin time:  ____________ Date: ____________ Movements: ____________ Start time: ____________ Doreatha Martin time: ____________ Date: ____________ Movements: ____________ Start time: ____________ Doreatha Martin time: ____________  Date: ____________ Movements: ____________ Start time: ____________ Doreatha Martin time: ____________ Date: ____________ Movements: ____________ Start time: ____________ Doreatha Martin time: ____________ Date: ____________ Movements: ____________ Start time: ____________ Doreatha Martin time: ____________ Date: ____________ Movements: ____________ Start time: ____________ Doreatha Martin time: ____________ Date: ____________ Movements: ____________ Start time: ____________ Doreatha Martin time: ____________ Date: ____________ Movements: ____________ Start time: ____________ Doreatha Martin time: ____________   This information is not intended to replace advice given to you by your health care provider. Make sure you discuss any questions you have with your health care provider.   Document Released: 06/18/2006 Document Revised: 06/09/2014 Document Reviewed: 03/15/2012 Elsevier Interactive Patient Education [employer]

## 2016-01-30 ENCOUNTER — Ambulatory Visit (INDEPENDENT_AMBULATORY_CARE_PROVIDER_SITE_OTHER): Payer: Medicaid Other | Admitting: Certified Nurse Midwife

## 2016-01-30 VITALS — BP 131/78 | HR 94 | Wt 244.0 lb

## 2016-01-30 DIAGNOSIS — O99212 Obesity complicating pregnancy, second trimester: Secondary | ICD-10-CM

## 2016-01-30 DIAGNOSIS — O26843 Uterine size-date discrepancy, third trimester: Secondary | ICD-10-CM

## 2016-01-30 DIAGNOSIS — Z3402 Encounter for supervision of normal first pregnancy, second trimester: Secondary | ICD-10-CM

## 2016-01-30 DIAGNOSIS — E669 Obesity, unspecified: Secondary | ICD-10-CM

## 2016-01-30 DIAGNOSIS — O99213 Obesity complicating pregnancy, third trimester: Secondary | ICD-10-CM

## 2016-01-30 LAB — POCT URINALYSIS DIP (DEVICE)
BILIRUBIN URINE: NEGATIVE
Glucose, UA: NEGATIVE mg/dL
HGB URINE DIPSTICK: NEGATIVE
LEUKOCYTES UA: NEGATIVE
NITRITE: NEGATIVE
Protein, ur: NEGATIVE mg/dL
SPECIFIC GRAVITY, URINE: 1.02 (ref 1.005–1.030)
Urobilinogen, UA: 0.2 mg/dL (ref 0.0–1.0)
pH: 6.5 (ref 5.0–8.0)

## 2016-01-30 NOTE — Progress Notes (Signed)
Breastfeeding discussed with patient  

## 2016-01-30 NOTE — Progress Notes (Signed)
Subjective:  Rachel Ritter is a 25 y.o. G1P0 at 5139w2d being seen today for ongoing prenatal care.  She is currently monitored for the following issues for this low-risk pregnancy and has Supervision of normal first pregnancy in second trimester and Obesity during pregnancy in second trimester on her problem list.  Patient reports intermittent pain LLQ and RLQ, worse with position changes, consistent with round ligament pain.  Contractions: Irritability. Vag. Bleeding: None.  Movement: Present. Denies leaking of fluid.   The following portions of the patient's history were reviewed and updated as appropriate: allergies, current medications, past family history, past medical history, past social history, past surgical history and problem list. Problem list updated.  Objective:   Vitals:   01/30/16 0916  BP: 131/78  Pulse: 94  Weight: 244 lb (110.7 kg)    Fetal Status: Fetal Heart Rate (bpm): 138 Fundal Height: 40 cm Movement: Present     General:  Alert, oriented and cooperative. Patient is in no acute distress.  Skin: Skin is warm and dry. No rash noted.   Cardiovascular: Normal heart rate noted  Respiratory: Normal respiratory effort, no problems with respiration noted  Abdomen: Soft, gravid, appropriate for gestational age. Pain/Pressure: Present     Pelvic: Vag. Bleeding: None Vag D/C Character: White   Cervical exam deferred        Extremities: Normal range of motion.  Edema: Mild pitting, slight indentation  Mental Status: Normal mood and affect. Normal behavior. Normal judgment and thought content.   Urinalysis: Urine Protein: Negative Urine Glucose: Negative  Assessment and Plan:  Pregnancy: G1P0 at 1539w2d  1. Uterine size date discrepancy pregnancy, third trimester - unclear if discrepancy d/t maternal habitus - US MFM OB LIMITED; Future  2. Obesity during pregnancy in second trimester  3. Supervision of normal first pregnancy in second trimester - GBS next visit -  Third trimester anticipatory guidance   Preterm labor symptoms and general obstetric precautions including but not limited to vaginal bleeding, contractions, leaking of fluid and fetal movement were reviewed in detail with the patient. Please refer to After Visit Summary for other counseling recommendations.  Return in about 1 week (around 02/06/2016).   Donette LarryMelanie Asami Lambright, CNM

## 2016-02-05 ENCOUNTER — Ambulatory Visit (HOSPITAL_COMMUNITY)
Admission: RE | Admit: 2016-02-05 | Discharge: 2016-02-05 | Disposition: A | Discharge status: Home / Self Care | Payer: Medicaid Other | Source: Ambulatory Visit | Attending: Certified Nurse Midwife | Admitting: Certified Nurse Midwife

## 2016-02-05 ENCOUNTER — Other Ambulatory Visit: Payer: Self-pay | Admitting: Certified Nurse Midwife

## 2016-02-05 DIAGNOSIS — Z3A36 36 weeks gestation of pregnancy: Secondary | ICD-10-CM | POA: Insufficient documentation

## 2016-02-05 DIAGNOSIS — O26843 Uterine size-date discrepancy, third trimester: Secondary | ICD-10-CM | POA: Insufficient documentation

## 2016-02-05 DIAGNOSIS — O99213 Obesity complicating pregnancy, third trimester: Secondary | ICD-10-CM

## 2016-02-05 DIAGNOSIS — O3663X0 Maternal care for excessive fetal growth, third trimester, not applicable or unspecified: Secondary | ICD-10-CM

## 2016-02-06 DIAGNOSIS — O3660X0 Maternal care for excessive fetal growth, unspecified trimester, not applicable or unspecified: Secondary | ICD-10-CM | POA: Insufficient documentation

## 2016-02-13 ENCOUNTER — Ambulatory Visit (INDEPENDENT_AMBULATORY_CARE_PROVIDER_SITE_OTHER): Payer: Medicaid Other | Admitting: Family

## 2016-02-13 ENCOUNTER — Other Ambulatory Visit (HOSPITAL_COMMUNITY)
Admission: RE | Admit: 2016-02-13 | Discharge: 2016-02-13 | Disposition: A | Discharge status: Home / Self Care | Payer: Medicaid Other | Source: Ambulatory Visit | Attending: Family | Admitting: Family

## 2016-02-13 VITALS — BP 125/73 | HR 89 | Wt 252.0 lb

## 2016-02-13 DIAGNOSIS — Z113 Encounter for screening for infections with a predominantly sexual mode of transmission: Secondary | ICD-10-CM

## 2016-02-13 DIAGNOSIS — Z3490 Encounter for supervision of normal pregnancy, unspecified, unspecified trimester: Secondary | ICD-10-CM | POA: Insufficient documentation

## 2016-02-13 DIAGNOSIS — O2243 Hemorrhoids in pregnancy, third trimester: Secondary | ICD-10-CM

## 2016-02-13 DIAGNOSIS — Z3493 Encounter for supervision of normal pregnancy, unspecified, third trimester: Secondary | ICD-10-CM

## 2016-02-13 DIAGNOSIS — Z3403 Encounter for supervision of normal first pregnancy, third trimester: Secondary | ICD-10-CM | POA: Diagnosis present

## 2016-02-13 LAB — POCT URINALYSIS DIP (DEVICE)
GLUCOSE, UA: NEGATIVE mg/dL
Hgb urine dipstick: NEGATIVE
Ketones, ur: NEGATIVE mg/dL
LEUKOCYTES UA: NEGATIVE
NITRITE: NEGATIVE
PROTEIN: 30 mg/dL — AB
Specific Gravity, Urine: 1.02 (ref 1.005–1.030)
UROBILINOGEN UA: 1 mg/dL (ref 0.0–1.0)
pH: 7 (ref 5.0–8.0)

## 2016-02-13 LAB — OB RESULTS CONSOLE GBS: GBS: NEGATIVE

## 2016-02-13 LAB — OB RESULTS CONSOLE GC/CHLAMYDIA: Gonorrhea: NEGATIVE

## 2016-02-13 MED ORDER — HYDROCORTISONE ACE-PRAMOXINE 1-1 % RE FOAM: 1.0000 | Freq: Two times a day (BID) | RECTAL | 0 refills | Status: DC

## 2016-02-13 NOTE — Progress Notes (Signed)
   PRENATAL VISIT NOTE  Subjective:  Rachel Ritter is a 25 y.o. G1P0 at 3844w2d being seen today for ongoing prenatal care.  She is currently monitored for the following issues for this low-risk pregnancy and has Supervision of normal first pregnancy in second trimester; Obesity during pregnancy in second trimester; Large for gestational age fetus affecting mother, antepartum; Hemorrhoids during pregnancy in third trimester; and Supervision of normal pregnancy on her problem list.  Patient reports hemorrhoids.  Contractions: Irritability. Vag. Bleeding: None.  Movement: Present. Denies leaking of fluid.   The following portions of the patient's history were reviewed and updated as appropriate: allergies, current medications, past family history, past medical history, past social history, past surgical history and problem list. Problem list updated.  Objective:   Vitals:   02/13/16 0754 02/13/16 0759  BP: (!) 125/91 125/73  Pulse: 89   Weight: 252 lb (114.3 kg)     Fetal Status: Fetal Heart Rate (bpm): 135 Fundal Height: 39 cm Movement: Present  Presentation: Vertex  General:  Alert, oriented and cooperative. Patient is in no acute distress.  Skin: Skin is warm and dry. No rash noted.   Cardiovascular: Normal heart rate noted  Respiratory: Normal respiratory effort, no problems with respiration noted  Abdomen: Soft, gravid, appropriate for gestational age. Pain/Pressure: Present     Pelvic:  Cervical exam performed      ; +hemorrhoids  Extremities: Normal range of motion.  Edema: Mild pitting, slight indentation  Mental Status: Normal mood and affect. Normal behavior. Normal judgment and thought content.   Urinalysis: Urine Protein: 1+ Urine Glucose: Negative  Assessment and Plan:  Pregnancy: G1P0 at 2844w2d  1. Normal first pregnancy confirmed, third trimester - Culture, Grp B Strep w/Rflx Suscept - GC/Chlamydia probe amp ([facility] - Flu Vaccine QUAD 36+ mos IM  (Fluarix, Quad PF)  2.Hemorrhoids during pregnancy in third trimester - hydrocortisone-pramoxine (PROCTOFOAM HC) rectal foam; Place 1 applicator rectally 2 (two) times daily.  Dispense: 10 g; Refill: 0  Term labor symptoms and general obstetric precautions including but not limited to vaginal bleeding, contractions, leaking of fluid and fetal movement were reviewed in detail with the patient. Please refer to After Visit Summary for other counseling recommendations.  Return in 1 week (on 02/20/2016).  Rachel Ritter, CNM

## 2016-02-13 NOTE — Patient Instructions (Signed)
Group B streptococcus (GBS) is a type of bacteria often found in healthy women. GBS is not the same as the bacteria that causes strep throat. You may have GBS in your vagina, rectum, or bladder. GBS does not spread through sexual contact, but it can be passed to a baby during childbirth. This can be dangerous for your baby. It is not dangerous to you and usually does not cause any symptoms. Your health care provider may test you for GBS when your pregnancy is between 35 and 37 weeks. GBS is dangerous only during birth, so there is no need to test for it earlier. It is possible to have GBS during pregnancy and never pass it to your baby. If your test results are positive for GBS, your health care provider may recommend giving you antibiotic medicine during delivery to make sure your baby stays healthy. RISK FACTORS You are more likely to pass GBS to your baby if:   Your water breaks (ruptured membrane) or you go into labor before 37 weeks.  Your water breaks 18 hours before you deliver.  You passed GBS during a previous pregnancy.  You have a urinary tract infection caused by GBS any time during pregnancy.  You have a fever during labor. SYMPTOMS Most women who have GBS do not have any symptoms. If you have a urinary tract infection caused by GBS, you might have frequent or painful urination and fever. Babies who get GBS usually show symptoms within 7 days of birth. Symptoms may include:   Breathing problems.  Heart and blood pressure problems.  Digestive and kidney problems. DIAGNOSIS Routine screening for GBS is recommended for all pregnant women. A health care provider takes a sample of the fluid in your vagina and rectum with a swab. It is then sent to a lab to be checked for GBS. A sample of your urine may also be checked for the bacteria.  TREATMENT If you test positive for GBS, you may need treatment with an antibiotic medicine during labor. As soon as you go into labor, or as soon as  your membranes rupture, you will get the antibiotic medicine through an IV access. You will continue to get the medicine until after you give birth. You do not need antibiotic medicine if you are having a cesarean delivery.If your baby shows signs or symptoms of GBS after birth, your baby can also be treated with an antibiotic medicine. HOME CARE INSTRUCTIONS   Take all antibiotic medicine as prescribed by your health care provider. Only take medicine as directed.   Continue with prenatal visits and care.   Keep all follow-up appointments.  SEEK MEDICAL CARE IF:   You have pain when you urinate.   You have to urinate frequently.   You have a fever.  SEEK IMMEDIATE MEDICAL CARE IF:   Your membranes rupture.  You go into labor.   This information is not intended to replace advice given to you by your health care provider. Make sure you discuss any questions you have with your health care provider.   Document Released: 08/26/2007 Document Revised: 05/24/2013 Document Reviewed: 03/11/2013 Elsevier Interactive Patient Education 2016 [employer]  

## 2016-02-14 LAB — GC/CHLAMYDIA PROBE AMP (~~LOC~~) NOT AT ARMC
CHLAMYDIA, DNA PROBE: NEGATIVE
NEISSERIA GONORRHEA: NEGATIVE

## 2016-02-15 LAB — CULTURE, STREPTOCOCCUS GRP B W/SUSCEPT

## 2016-02-20 ENCOUNTER — Encounter (HOSPITAL_COMMUNITY): Payer: Self-pay

## 2016-02-20 ENCOUNTER — Inpatient Hospital Stay (HOSPITAL_COMMUNITY)
Admission: AD | Admit: 2016-02-20 | Discharge: 2016-02-23 | DRG: 775 | Disposition: A | Discharge status: Home / Self Care | Payer: Medicaid Other | Source: Ambulatory Visit | Attending: Obstetrics and Gynecology | Admitting: Obstetrics and Gynecology

## 2016-02-20 DIAGNOSIS — Z833 Family history of diabetes mellitus: Secondary | ICD-10-CM | POA: Diagnosis not present

## 2016-02-20 DIAGNOSIS — IMO0001 Reserved for inherently not codable concepts without codable children: Secondary | ICD-10-CM

## 2016-02-20 DIAGNOSIS — O134 Gestational [pregnancy-induced] hypertension without significant proteinuria, complicating childbirth: Principal | ICD-10-CM | POA: Diagnosis present

## 2016-02-20 DIAGNOSIS — Z823 Family history of stroke: Secondary | ICD-10-CM

## 2016-02-20 DIAGNOSIS — Z8249 Family history of ischemic heart disease and other diseases of the circulatory system: Secondary | ICD-10-CM

## 2016-02-20 DIAGNOSIS — Z3402 Encounter for supervision of normal first pregnancy, second trimester: Secondary | ICD-10-CM

## 2016-02-20 DIAGNOSIS — O4202 Full-term premature rupture of membranes, onset of labor within 24 hours of rupture: Secondary | ICD-10-CM | POA: Diagnosis present

## 2016-02-20 DIAGNOSIS — O139 Gestational [pregnancy-induced] hypertension without significant proteinuria, unspecified trimester: Secondary | ICD-10-CM | POA: Diagnosis present

## 2016-02-20 DIAGNOSIS — O2243 Hemorrhoids in pregnancy, third trimester: Secondary | ICD-10-CM | POA: Diagnosis present

## 2016-02-20 DIAGNOSIS — O3663X0 Maternal care for excessive fetal growth, third trimester, not applicable or unspecified: Secondary | ICD-10-CM | POA: Diagnosis present

## 2016-02-20 DIAGNOSIS — O99214 Obesity complicating childbirth: Secondary | ICD-10-CM | POA: Diagnosis present

## 2016-02-20 DIAGNOSIS — Z3A38 38 weeks gestation of pregnancy: Secondary | ICD-10-CM | POA: Diagnosis not present

## 2016-02-20 DIAGNOSIS — Z6841 Body Mass Index (BMI) 40.0 and over, adult: Secondary | ICD-10-CM | POA: Diagnosis not present

## 2016-02-20 DIAGNOSIS — O99212 Obesity complicating pregnancy, second trimester: Secondary | ICD-10-CM

## 2016-02-20 LAB — CBC
HCT: 31.5 % — ABNORMAL LOW (ref 36.0–46.0)
HEMOGLOBIN: 10.1 g/dL — AB (ref 12.0–15.0)
MCH: 25.4 pg — AB (ref 26.0–34.0)
MCHC: 32.1 g/dL (ref 30.0–36.0)
MCV: 79.1 fL (ref 78.0–100.0)
PLATELETS: 231 10*3/uL (ref 150–400)
RBC: 3.98 MIL/uL (ref 3.87–5.11)
RDW: 17.1 % — ABNORMAL HIGH (ref 11.5–15.5)
WBC: 6 10*3/uL (ref 4.0–10.5)

## 2016-02-20 LAB — TYPE AND SCREEN
ABO/RH(D): O POS
ANTIBODY SCREEN: NEGATIVE

## 2016-02-20 LAB — PROTEIN / CREATININE RATIO, URINE
CREATININE, URINE: 298 mg/dL
PROTEIN CREATININE RATIO: 0.14 mg/mg{creat} (ref 0.00–0.15)
TOTAL PROTEIN, URINE: 43 mg/dL

## 2016-02-20 LAB — COMPREHENSIVE METABOLIC PANEL
ALBUMIN: 2.8 g/dL — AB (ref 3.5–5.0)
ALT: 16 U/L (ref 14–54)
ANION GAP: 6 (ref 5–15)
AST: 17 U/L (ref 15–41)
Alkaline Phosphatase: 153 U/L — ABNORMAL HIGH (ref 38–126)
BUN: 7 mg/dL (ref 6–20)
CALCIUM: 9 mg/dL (ref 8.9–10.3)
CHLORIDE: 106 mmol/L (ref 101–111)
CO2: 23 mmol/L (ref 22–32)
CREATININE: 0.7 mg/dL (ref 0.44–1.00)
GFR calc Af Amer: >60 mL/min (ref >60)
GFR calc non Af Amer: >60 mL/min (ref >60)
Glucose, Bld: 115 mg/dL — ABNORMAL HIGH (ref 65–99)
POTASSIUM: 3.9 mmol/L (ref 3.5–5.1)
SODIUM: 135 mmol/L (ref 135–145)
TOTAL PROTEIN: 6.2 g/dL — AB (ref 6.5–8.1)
Total Bilirubin: 0.5 mg/dL (ref 0.3–1.2)

## 2016-02-20 LAB — ABO/RH: ABO/RH(D): O POS

## 2016-02-20 MED ORDER — TERBUTALINE SULFATE 1 MG/ML IJ SOLN
0.2500 mg | Freq: Once | INTRAMUSCULAR | Status: DC | PRN
  Filled 2016-02-20: qty 1

## 2016-02-20 MED ORDER — FENTANYL CITRATE (PF) 100 MCG/2ML IJ SOLN
50.0000 ug | INTRAMUSCULAR | Status: DC | PRN
  Administered 2016-02-20: 100 ug via INTRAVENOUS
  Filled 2016-02-20: qty 2

## 2016-02-20 MED ORDER — LIDOCAINE HCL (PF) 1 % IJ SOLN
30.0000 mL | INTRAMUSCULAR | Status: DC | PRN
  Filled 2016-02-20: qty 30

## 2016-02-20 MED ORDER — SOD CITRATE-CITRIC ACID 500-334 MG/5ML PO SOLN: 30.0000 mL | ORAL | Status: DC | PRN

## 2016-02-20 MED ORDER — LACTATED RINGERS IV SOLN
INTRAVENOUS | Status: DC
  Administered 2016-02-20: 11:00:00 via INTRAVENOUS

## 2016-02-20 MED ORDER — ONDANSETRON HCL 4 MG/2ML IJ SOLN: 4.0000 mg | Freq: Four times a day (QID) | INTRAMUSCULAR | Status: DC | PRN

## 2016-02-20 MED ORDER — OXYTOCIN 40 UNITS IN LACTATED RINGERS INFUSION - SIMPLE MED
2.5000 [IU]/h | INTRAVENOUS | Status: DC
  Administered 2016-02-21: 2.5 [IU]/h via INTRAVENOUS

## 2016-02-20 MED ORDER — OXYCODONE-ACETAMINOPHEN 5-325 MG PO TABS: 1.0000 | ORAL_TABLET | ORAL | Status: DC | PRN

## 2016-02-20 MED ORDER — LACTATED RINGERS IV SOLN: 500.0000 mL | INTRAVENOUS | Status: DC | PRN

## 2016-02-20 MED ORDER — ACETAMINOPHEN 325 MG PO TABS: 650.0000 mg | ORAL_TABLET | ORAL | Status: DC | PRN

## 2016-02-20 MED ORDER — FLEET ENEMA 7-19 GM/118ML RE ENEM: 1.0000 | ENEMA | RECTAL | Status: DC | PRN

## 2016-02-20 MED ORDER — OXYCODONE-ACETAMINOPHEN 5-325 MG PO TABS: 2.0000 | ORAL_TABLET | ORAL | Status: DC | PRN

## 2016-02-20 MED ORDER — OXYTOCIN BOLUS FROM INFUSION
500.0000 mL | Freq: Once | INTRAVENOUS | Status: AC
  Administered 2016-02-21: 500 mL via INTRAVENOUS

## 2016-02-20 MED ORDER — BUTORPHANOL TARTRATE 2 MG/ML IJ SOLN
2.0000 mg | INTRAMUSCULAR | Status: DC | PRN
  Administered 2016-02-20: 2 mg via INTRAVENOUS
  Filled 2016-02-20 (×2): qty 2

## 2016-02-20 MED ORDER — PROMETHAZINE HCL 25 MG/ML IJ SOLN
25.0000 mg | Freq: Four times a day (QID) | INTRAMUSCULAR | Status: DC | PRN
Start: 2016-02-20 — End: 2016-02-21
  Administered 2016-02-20: 25 mg via INTRAVENOUS
  Filled 2016-02-20: qty 1

## 2016-02-20 MED ORDER — MISOPROSTOL 25 MCG QUARTER TABLET
25.0000 ug | ORAL_TABLET | ORAL | Status: DC | PRN
  Administered 2016-02-20 (×2): 25 ug via VAGINAL
  Filled 2016-02-20 (×2): qty 0.25
  Filled 2016-02-20: qty 1

## 2016-02-20 NOTE — H&P (Signed)
LABOR AND DELIVERY ADMISSION HISTORY AND PHYSICAL NOTE  Rachel Ritter is a 25 y.o. female G1P0 with IUP at [redacted]w[redacted]d by 10wk [place] presenting for IOL for gestational hypertension.   She reports positive fetal movement. She denies leakage of fluid or vaginal bleeding.  Prenatal History/Complications:  Past Medical History: Past Medical History:  Diagnosis Date  . Medical history non-contributory     Past Surgical History: Past Surgical History:  Procedure Laterality Date  . NO PAST SURGERIES      Obstetrical History: OB History    Gravida Para Term Preterm AB Living   1         0   SAB TAB Ectopic Multiple Live Births                  Social History: Social History   Social History  . Marital status: Single    Spouse name: N/A  . Number of children: N/A  . Years of education: N/A   Social History Main Topics  . Smoking status: Never Smoker  . Smokeless tobacco: Never Used  . Alcohol use No  . Drug use: No  . Sexual activity: Yes    Birth control/ protection: None   Other Topics Concern  . None   Social History Narrative  . None    Family History: Family History  Problem Relation Age of Onset  . Hypertension Mother   . Hypertension Sister   . Hypertension Maternal Aunt   . Hypertension Maternal Grandfather   . Diabetes Paternal Grandmother   . Stroke Paternal Grandmother     Allergies: Allergies  Allergen Reactions  . Amoxicillin Hives    Has patient had a PCN reaction causing immediate rash, facial/tongue/throat swelling, SOB or lightheadedness with hypotension: No Has patient had a PCN reaction causing severe rash involving mucus membranes or skin necrosis: No Has patient had a PCN reaction that required hospitalization No Has patient had a PCN reaction occurring within the last 10 years: Yes If all of the above answers are "NO", then may proceed with Cephalosporin use.     Prescriptions Prior to Admission  Medication Sig Dispense Refill Last  Dose  . phenylephrine-shark liver oil-mineral oil-petrolatum (PREPARATION H) 0.25-3-14-71.9 % rectal ointment Place 1 application rectally 2 (two) times daily as needed for hemorrhoids.   02/19/2016 at Unknown time  . Prenatal Vit-Fe Fumarate-FA (PRENATAL MULTIVITAMIN) TABS tablet Take 2 tablets by mouth daily at 12 noon.   02/19/2016 at Unknown time  . hydrocortisone-pramoxine (PROCTOFOAM HC) rectal foam Place 1 applicator rectally 2 (two) times daily. (Patient not taking: Reported on 02/20/2016) 10 g 0 More than a month at Unknown time     Review of Systems   All systems reviewed and negative except as stated in HPI  Blood pressure (!) 142/71, pulse 78, temperature 98.7 F (37.1 C), temperature source Oral, resp. rate 18, height 5\' 5"  (1.651 m), weight 257 lb 12.8 oz (116.9 kg), SpO2 99 %. General appearance: alert, cooperative and appears stated age Lungs: clear to auscultation bilaterally Heart: regular rate and rhythm Abdomen: soft, non-tender; bowel sounds normal Extremities: No calf swelling or tenderness Presentation: cephalic by [place] in MAU Fetal monitoring: category 1 Uterine activity: irregular contractions Dilation: Closed Effacement (%): 60 Station: -3 Exam by:: J.Cox, RN   Prenatal labs: ABO, Rh: --/--/O POS (09/20 1020) Antibody: NEG (09/20 1020) Rubella: !Error! RPR: NON REAC (06/28 1507)  HBsAg: NEGATIVE (04/05 1003)  HIV: NONREACTIVE (06/28 1507)  GBS: Negative (09/13  0000)  1 hr Glucola: normal Genetic screening:  declined Anatomy US: normal  Prenatal Transfer Tool  Maternal Diabetes: No Genetic Screening: Declined Maternal Ultrasounds/Referrals: Normal Fetal Ultrasounds or other Referrals:  None Maternal Substance Abuse:  No Significant Maternal Medications:  None Significant Maternal Lab Results: Lab values include: Group B Strep negative  Results for orders placed or performed during the hospital encounter of 02/20/16 (from the past 24 hour(s))   Protein / creatinine ratio, urine   Collection Time: 02/20/16  9:56 AM  Result Value Ref Range   Creatinine, Urine 298.00 mg/dL   Total Protein, Urine 43 mg/dL   Protein Creatinine Ratio 0.14 0.00 - 0.15 mg/mg[Cre]  Comprehensive metabolic panel   Collection Time: 02/20/16 10:20 AM  Result Value Ref Range   Sodium 135 135 - 145 mmol/L   Potassium 3.9 3.5 - 5.1 mmol/L   Chloride 106 101 - 111 mmol/L   CO2 23 22 - 32 mmol/L   Glucose, Bld 115 (H) 65 - 99 mg/dL   BUN 7 6 - 20 mg/dL   Creatinine, Ser 1.610.70 0.44 - 1.00 mg/dL   Calcium 9.0 8.9 - 09.610.3 mg/dL   Total Protein 6.2 (L) 6.5 - 8.1 g/dL   Albumin 2.8 (L) 3.5 - 5.0 g/dL   AST 17 15 - 41 U/L   ALT 16 14 - 54 U/L   Alkaline Phosphatase 153 (H) 38 - 126 U/L   Total Bilirubin 0.5 0.3 - 1.2 mg/dL   GFR calc non Af Amer >60 >60 mL/min   GFR calc Af Amer >60 >60 mL/min   Anion gap 6 5 - 15  CBC   Collection Time: 02/20/16 10:20 AM  Result Value Ref Range   WBC 6.0 4.0 - 10.5 K/uL   RBC 3.98 3.87 - 5.11 MIL/uL   Hemoglobin 10.1 (L) 12.0 - 15.0 g/dL   HCT 04.531.5 (L) 40.936.0 - 81.146.0 %   MCV 79.1 78.0 - 100.0 fL   MCH 25.4 (L) 26.0 - 34.0 pg   MCHC 32.1 30.0 - 36.0 g/dL   RDW 91.417.1 (H) 78.211.5 - 95.615.5 %   Platelets 231 150 - 400 K/uL  Type and screen [facility]   Collection Time: 02/20/16 10:20 AM  Result Value Ref Range   ABO/RH(D) O POS    Antibody Screen NEG    Sample Expiration 02/23/2016     Patient Active Problem List   Diagnosis Date Noted  . Gestational hypertension 02/20/2016  . Hemorrhoids during pregnancy in third trimester 02/13/2016  . Supervision of normal pregnancy 02/13/2016  . Large for gestational age fetus affecting mother, antepartum 02/06/2016  . Obesity during pregnancy in second trimester 10/03/2015  . Supervision of normal first pregnancy in second trimester 09/05/2015    Assessment: Rachel Ritter is a 25 y.o. G1P0 at 6149w2d here for IOl for Gestational hypertension  #Labor: pt is  closed, will start with cytotec, plan for foley balloon in 4 hours. #Pain: Plans for epidural #FWB: Category 1 #ID:  GBS neg #MOF: breast #MOC: condoms #Circ:  Outpatient #gHTN: one severe range BP in MAU. Will continue to monitor at this time. Start labetolol protocol if develops severe range pressures. PIH labs wnl.  Ernestina Pennaicholas Ilyas Lipsitz 02/20/2016, 1:00 PM

## 2016-02-20 NOTE — MAU Note (Signed)
Pt c/o contractions every 7 minutes since 0230am. Pt denies bleeding and leaking of fluid. Pt states baby is moving normally. Pt c/o swelling her feet and legs today.

## 2016-02-20 NOTE — Anesthesia Pain Management Evaluation Note (Signed)
  CRNA Pain Management Visit Note  Patient: Rachel Ritter, 25 y.o., female  "Hello I am a member of the anesthesia team at [facility]. We have an anesthesia team available at all times to provide care throughout the hospital, including epidural management and anesthesia for C-section. I don't know your plan for the delivery whether it a natural birth, water birth, IV sedation, nitrous supplementation, doula or epidural, but we want to meet your pain goals."   1.Was your pain managed to your expectations on prior hospitalizations?   No prior hospitalizations  2.What is your expectation for pain management during this hospitalization?     Epidural  3.How can we help you reach that goal? unsure  Record the patient's initial score and the patient's pain goal.   Pain: 4  Pain Goal: 4 [facility] wants you to be able to say your pain was always managed very well.  Rachel Ritter,[name] 02/20/2016

## 2016-02-20 NOTE — Progress Notes (Signed)
Patient ID: Rachel Ritter, female   DOB: 21-Feb-1991, 25 y.o.   MRN: [id number]  Pt s/p cytotec x 2 doses; SROM @ 1945 Afeb, BPs 131/76, 130/59, 157/89 (in pain)  FHR 150s, +accels, no decels Ctx q 2-3 mins Cx 1+/70/-2  IUP@term  gHTN IOL process- cx unfavorable  Foley bulb inserted without difficulty Watch BP Stadol/Phen for pain now  Cam HaiSHAW, Tiffini Blacksher CNM 02/20/2016 9:40 PM

## 2016-02-21 ENCOUNTER — Encounter: Payer: Medicaid Other | Admitting: Certified Nurse Midwife

## 2016-02-21 ENCOUNTER — Encounter (HOSPITAL_COMMUNITY): Payer: Self-pay | Admitting: Anesthesiology

## 2016-02-21 ENCOUNTER — Inpatient Hospital Stay (HOSPITAL_COMMUNITY): Payer: Medicaid Other | Admitting: Anesthesiology

## 2016-02-21 DIAGNOSIS — Z3A38 38 weeks gestation of pregnancy: Secondary | ICD-10-CM

## 2016-02-21 DIAGNOSIS — O3663X0 Maternal care for excessive fetal growth, third trimester, not applicable or unspecified: Secondary | ICD-10-CM

## 2016-02-21 DIAGNOSIS — O134 Gestational [pregnancy-induced] hypertension without significant proteinuria, complicating childbirth: Secondary | ICD-10-CM

## 2016-02-21 LAB — CBC
HEMATOCRIT: 33.4 % — AB (ref 36.0–46.0)
HEMOGLOBIN: 10.9 g/dL — AB (ref 12.0–15.0)
MCH: 25.6 pg — AB (ref 26.0–34.0)
MCHC: 32.6 g/dL (ref 30.0–36.0)
MCV: 78.4 fL (ref 78.0–100.0)
Platelets: 258 10*3/uL (ref 150–400)
RBC: 4.26 MIL/uL (ref 3.87–5.11)
RDW: 17.3 % — ABNORMAL HIGH (ref 11.5–15.5)
WBC: 11.4 10*3/uL — ABNORMAL HIGH (ref 4.0–10.5)

## 2016-02-21 LAB — RPR: RPR Ser Ql: NONREACTIVE

## 2016-02-21 MED ORDER — DIPHENHYDRAMINE HCL 50 MG/ML IJ SOLN
12.5000 mg | INTRAMUSCULAR | Status: DC | PRN
Start: 2016-02-21 — End: 2016-02-21

## 2016-02-21 MED ORDER — DIPHENHYDRAMINE HCL 25 MG PO CAPS: 25.0000 mg | ORAL_CAPSULE | Freq: Four times a day (QID) | ORAL | Status: DC | PRN

## 2016-02-21 MED ORDER — SENNOSIDES-DOCUSATE SODIUM 8.6-50 MG PO TABS
2.0000 | ORAL_TABLET | ORAL | Status: DC
  Filled 2016-02-21 (×2): qty 2

## 2016-02-21 MED ORDER — IBUPROFEN 600 MG PO TABS
600.0000 mg | ORAL_TABLET | Freq: Four times a day (QID) | ORAL | Status: DC
  Administered 2016-02-21 – 2016-02-23 (×7): 600 mg via ORAL
  Filled 2016-02-21 (×7): qty 1

## 2016-02-21 MED ORDER — FENTANYL 2.5 MCG/ML BUPIVACAINE 1/10 % EPIDURAL INFUSION (WH - ANES)
14.0000 mL/h | INTRAMUSCULAR | Status: DC | PRN
  Administered 2016-02-21: 14 mL/h via EPIDURAL
  Filled 2016-02-21: qty 125

## 2016-02-21 MED ORDER — SIMETHICONE 80 MG PO CHEW: 80.0000 mg | CHEWABLE_TABLET | ORAL | Status: DC | PRN

## 2016-02-21 MED ORDER — PRENATAL MULTIVITAMIN CH
1.0000 | ORAL_TABLET | Freq: Every day | ORAL | Status: DC
  Administered 2016-02-21 – 2016-02-22 (×2): 1 via ORAL
  Filled 2016-02-21 (×2): qty 1

## 2016-02-21 MED ORDER — ACETAMINOPHEN 325 MG PO TABS
650.0000 mg | ORAL_TABLET | ORAL | Status: DC | PRN
  Administered 2016-02-21: 650 mg via ORAL
  Filled 2016-02-21: qty 2

## 2016-02-21 MED ORDER — ONDANSETRON HCL 4 MG PO TABS: 4.0000 mg | ORAL_TABLET | ORAL | Status: DC | PRN

## 2016-02-21 MED ORDER — LACTATED RINGERS IV SOLN: 500.0000 mL | Freq: Once | INTRAVENOUS | Status: DC

## 2016-02-21 MED ORDER — COCONUT OIL OIL: 1.0000 "application " | TOPICAL_OIL | Status: DC | PRN

## 2016-02-21 MED ORDER — EPHEDRINE 5 MG/ML INJ
10.0000 mg | INTRAVENOUS | Status: DC | PRN
  Filled 2016-02-21: qty 4

## 2016-02-21 MED ORDER — OXYTOCIN 40 UNITS IN LACTATED RINGERS INFUSION - SIMPLE MED
1.0000 m[IU]/min | INTRAVENOUS | Status: DC
  Administered 2016-02-21: 2 m[IU]/min via INTRAVENOUS
  Filled 2016-02-21: qty 1000

## 2016-02-21 MED ORDER — LIDOCAINE HCL (PF) 1 % IJ SOLN
INTRAMUSCULAR | Status: DC | PRN
  Administered 2016-02-21 (×2): 6 mL via EPIDURAL

## 2016-02-21 MED ORDER — WITCH HAZEL-GLYCERIN EX PADS: 1.0000 "application " | MEDICATED_PAD | CUTANEOUS | Status: DC | PRN

## 2016-02-21 MED ORDER — DIBUCAINE 1 % RE OINT: 1.0000 "application " | TOPICAL_OINTMENT | RECTAL | Status: DC | PRN

## 2016-02-21 MED ORDER — BENZOCAINE-MENTHOL 20-0.5 % EX AERO: 1.0000 "application " | INHALATION_SPRAY | CUTANEOUS | Status: DC | PRN

## 2016-02-21 MED ORDER — PHENYLEPHRINE 40 MCG/ML (10ML) SYRINGE FOR IV PUSH (FOR BLOOD PRESSURE SUPPORT)
80.0000 ug | PREFILLED_SYRINGE | INTRAVENOUS | Status: DC | PRN
  Administered 2016-02-21: 80 ug via INTRAVENOUS
  Filled 2016-02-21: qty 5
  Filled 2016-02-21: qty 10

## 2016-02-21 MED ORDER — PHENYLEPHRINE 40 MCG/ML (10ML) SYRINGE FOR IV PUSH (FOR BLOOD PRESSURE SUPPORT)
80.0000 ug | PREFILLED_SYRINGE | INTRAVENOUS | Status: DC | PRN
  Filled 2016-02-21: qty 5

## 2016-02-21 MED ORDER — TETANUS-DIPHTH-ACELL PERTUSSIS 5-2.5-18.5 LF-MCG/0.5 IM SUSP: 0.5000 mL | Freq: Once | INTRAMUSCULAR | Status: DC

## 2016-02-21 MED ORDER — ZOLPIDEM TARTRATE 5 MG PO TABS: 5.0000 mg | ORAL_TABLET | Freq: Every evening | ORAL | Status: DC | PRN

## 2016-02-21 MED ORDER — ONDANSETRON HCL 4 MG/2ML IJ SOLN: 4.0000 mg | INTRAMUSCULAR | Status: DC | PRN

## 2016-02-21 NOTE — Progress Notes (Signed)
Patient ID: [name], female   DOB: [birth year], 25 y.o.   MRN: [id number]  Mostly comfortable w/ epidural; foley out between 1-2am; now on Pit  VSS, afeb, BPs 123/65, 139/76 FHR 150s, +accels, occ mi variables Cx C/C/+2  IUP@term  gHTN End 1st stage  Will begin pushing with urge Anticipate SVD  Cam HaiSHAW, Kamorie Aldous CNM 02/21/2016 7:23 AM

## 2016-02-21 NOTE — Anesthesia Postprocedure Evaluation (Signed)
Anesthesia Post Note  Patient: Rachel Ritter  Procedure(s) Performed: * No procedures listed *  Patient location during evaluation: Mother Baby Anesthesia Type: Epidural Level of consciousness: awake and alert and oriented Pain management: satisfactory to patient Vital Signs Assessment: post-procedure vital signs reviewed and stable Respiratory status: spontaneous breathing and nonlabored ventilation Cardiovascular status: stable Postop Assessment: no headache, no backache, no signs of nausea or vomiting, adequate PO intake and patient able to bend at knees (patient up walking) Anesthetic complications: no     Last Vitals:  Vitals:   02/21/16 1143 02/21/16 1424  BP:  (!) 120/52  Pulse:  98  Resp:  (!) 22  Temp: 37.9 C 37.5 C    Last Pain:  Vitals:   02/21/16 1424  TempSrc: Oral  PainSc:    Pain Goal: Patients Stated Pain Goal: 0 (02/20/16 1009)               Brown Dunlap

## 2016-02-21 NOTE — Lactation Note (Signed)
This note was copied from a baby's chart. Lactation Consultation Note  Patient Name: Rachel Ritter's Date: 02/21/2016 Reason for consult: Initial assessment Breastfeeding consultation services and support information given and reviewed.  This is mom's first baby and he is 5 hours old.  She states baby has been to the breast once and he did well.  Mom has hand expressed and colostrum visible.  Instructed to feed with any feeding cue and to call with concerns/assist prn.  Maternal Data Has patient been taught Hand Expression?: Yes Does the patient have breastfeeding experience prior to this delivery?: No  Feeding Feeding Type: Breast Fed Length of feed: 2 min (Expressed colostrum. on off latching then sleepy,STS)  LATCH Score/Interventions                      Lactation Tools Discussed/Used     Consult Status Consult Status: Follow-up Date: 02/22/16 Follow-up type: In-patient    Huston FoleyMOULDEN, Marvens Hollars S 02/21/2016, 1:21 PM

## 2016-02-21 NOTE — Anesthesia Procedure Notes (Signed)
Epidural Patient location during procedure: OB Start time: 02/21/2016 1:02 AM End time: 02/21/2016 1:06 AM  Staffing Anesthesiologist: Leilani AbleHATCHETT, Beverlee Wilmarth Performed: anesthesiologist   Preanesthetic Checklist Completed: patient identified, surgical consent, pre-op evaluation, timeout performed, IV checked, risks and benefits discussed and monitors and equipment checked  Epidural Patient position: sitting Prep: site prepped and draped and DuraPrep Patient monitoring: continuous pulse ox and blood pressure Approach: midline Location: L3-L4 Injection technique: LOR air  Needle:  Needle type: Tuohy  Needle gauge: 17 G Needle length: 9 cm and 9 Needle insertion depth: 7 cm Catheter type: closed end flexible Catheter size: 19 Gauge Catheter at skin depth: 12 cm Test dose: negative and Other  Assessment Sensory level: T9 Events: blood not aspirated, injection not painful, no injection resistance, negative IV test and no paresthesia  Additional Notes Reason for block:procedure for pain

## 2016-02-21 NOTE — Anesthesia Preprocedure Evaluation (Signed)
Anesthesia Evaluation  Patient identified by MRN, date of birth, ID band Patient awake    Reviewed: Allergy & Precautions, H&P , NPO status , Patient's Chart, lab work & pertinent test results  Airway Mallampati: II  TM Distance: >3 FB Neck ROM: full    Dental no notable dental hx.    Pulmonary neg pulmonary ROS,    Pulmonary exam normal        Cardiovascular Normal cardiovascular exam     Neuro/Psych negative neurological ROS  negative psych ROS   GI/Hepatic negative GI ROS, Neg liver ROS,   Endo/Other  Morbid obesity  Renal/GU negative Renal ROS     Musculoskeletal   Abdominal (+) + obese,   Peds  Hematology negative hematology ROS (+)   Anesthesia Other Findings   Reproductive/Obstetrics (+) Pregnancy                             Anesthesia Physical Anesthesia Plan  ASA: III  Anesthesia Plan: Epidural   Post-op Pain Management:    Induction:   Airway Management Planned:   Additional Equipment:   Intra-op Plan:   Post-operative Plan:   Informed Consent: I have reviewed the patients History and Physical, chart, labs and discussed the procedure including the risks, benefits and alternatives for the proposed anesthesia with the patient or authorized representative who has indicated his/her understanding and acceptance.     Plan Discussed with:   Anesthesia Plan Comments:         Anesthesia Quick Evaluation  

## 2016-02-21 NOTE — Lactation Note (Signed)
This note was copied from a baby's chart. Lactation Consultation Note  Patient Name: Rachel Ritter ZOXWR'UToday's Date: 02/21/2016 Reason for consult: Follow-up assessment Mom called for assist.  Baby placed skin to skin in football hold.  Colostrum easily hand expressed.  Baby not showing much interest and no latch achieved.  Mom given a manual pump to use prior to feeding.  Encouraged to call when baby starts to cue.  Maternal Data Has patient been taught Hand Expression?: Yes Does the patient have breastfeeding experience prior to this delivery?: No  Feeding Feeding Type: Breast Fed  LATCH Score/Interventions Latch: Repeated attempts needed to sustain latch, nipple held in mouth throughout feeding, stimulation needed to elicit sucking reflex.  Audible Swallowing: None Intervention(s): Skin to skin;Hand expression  Type of Nipple: Everted at rest and after stimulation (short)  Comfort (Breast/Nipple): Soft / non-tender     Hold (Positioning): Assistance needed to correctly position infant at breast and maintain latch. Intervention(s): Breastfeeding basics reviewed;Support Pillows;Position options;Skin to skin  LATCH Score: 6  Lactation Tools Discussed/Used     Consult Status Consult Status: Follow-up Date: 02/22/16 Follow-up type: In-patient    Huston FoleyMOULDEN, Patience Nuzzo S 02/21/2016, 2:18 PM

## 2016-02-22 NOTE — Lactation Note (Signed)
This note was copied from a baby's chart. Lactation Consultation Note: mom called for assist with latch. Reports she is unable to hand express Colostrum and she thought the baby wasn't getting enough so she has given a few bottles of formula through the night. Last had 20 ml of formula about 1 hour ago. Attempted to latch baby- he took a few sucks then off to sleep. Encouraged to breast feed whenever she sees feeding cues. Has DEBP setup in room. Suggested pumping since he is sleepy now to promote milk supply. Does not have pump at home- she was going to wait and see how it goes before she got one. Encouraged to page for assist when baby more awake. No questions at present.   Patient Name: Boy Violeta Gelinasiera Morrison ZOXWR'UToday's Date: 02/22/2016 Reason for consult: Follow-up assessment   Maternal Data Formula Feeding for Exclusion: No Has patient been taught Hand Expression?: Yes Does the patient have breastfeeding experience prior to this delivery?: No  Feeding Feeding Type: Breast Fed Nipple Type: Slow - flow Length of feed: 3 min  LATCH Score/Interventions Latch: Too sleepy or reluctant, no latch achieved, no sucking elicited.  Audible Swallowing: None  Type of Nipple: Everted at rest and after stimulation  Comfort (Breast/Nipple): Soft / non-tender     Hold (Positioning): Assistance needed to correctly position infant at breast and maintain latch. Intervention(s): Breastfeeding basics reviewed;Support Pillows  LATCH Score: 5  Lactation Tools Discussed/Used     Consult Status Consult Status: Follow-up Date: 02/23/16 Follow-up type: In-patient    Pamelia HoitWeeks, Conlee Sliter D 02/22/2016, 9:24 AM

## 2016-02-22 NOTE — Progress Notes (Signed)
Post Partum Day 1 Subjective: no complaints, up ad lib, voiding, tolerating PO and + flatus  Objective: Blood pressure 123/60, pulse 90, temperature 98.9 F (37.2 C), temperature source Oral, resp. rate 20, height 5\' 5"  (1.651 m), weight 116.9 kg (257 lb 12.8 oz), SpO2 100 %, unknown if currently breastfeeding.  Physical Exam:  General: alert, cooperative and no distress Lochia: appropriate Uterine Fundus: firm DVT Evaluation: No evidence of DVT seen on physical exam. Negative Homan's sign.   Recent Labs  02/20/16 1020 02/21/16 0042  HGB 10.1* 10.9*  HCT 31.5* 33.4*    Assessment/Plan: Plan for discharge tomorrow   LOS: 2 days   Leland Herlsia J Yoo 02/22/2016, 7:31 AM   I have seen and examined this patient and agree the above assessment.  Respiratory effort normal, lochia appropriate, legs negative,  pain level normal.  CRESENZO-DISHMAN,Linn Goetze 02/25/2016 10:56 AM

## 2016-02-23 DIAGNOSIS — IMO0001 Reserved for inherently not codable concepts without codable children: Secondary | ICD-10-CM

## 2016-02-23 MED ORDER — IBUPROFEN 600 MG PO TABS: 600.0000 mg | ORAL_TABLET | Freq: Four times a day (QID) | ORAL | 0 refills | Status: DC

## 2016-02-23 NOTE — Discharge Instructions (Signed)

## 2016-02-23 NOTE — Discharge Summary (Signed)
OB Discharge Summary     Patient Name: Rachel Ritter DOB: Aug 18, 1990 MRN: [id number]  Date of admission: 02/20/2016 Delivering MD: Cam Hai D   Date of discharge: 02/23/2016  Admitting diagnosis: 0852 Intrauterine pregnancy: [redacted]w[redacted]d     Secondary diagnosis:  Active Problems:   Gestational hypertension   Status post vaginal delivery  Additional problems: none     Discharge diagnosis: Term Pregnancy Delivered                                                                                                Post partum procedures:none  Augmentation: Pitocin, Cytotec and Foley Balloon  Complications: None  Hospital course:  Induction of Labor With Vaginal Delivery   25 y.o. yo G1P1001 at [redacted]w[redacted]d was admitted to the hospital 02/20/2016 for induction of labor.  Indication for induction: Gestational hypertension.  Patient had an uncomplicated labor course as follows: Membrane Rupture Time/Date: 7:45 PM ,02/20/2016   Intrapartum Procedures: Episiotomy: None [1]                                         Lacerations:  None [1]  Patient had delivery of a Viable infant.  Information for the patient's newborn:  Rachel, Ritter [[id number]]  Delivery Method: Vag-Spont   02/21/2016  Details of delivery can be found in separate delivery note.  Patient had a routine postpartum course. Patient is discharged home 02/23/16.   Physical exam Vitals:   02/22/16 0900 02/22/16 1100 02/22/16 1749 02/23/16 0500  BP: 136/89 120/69 140/84 (!) 138/91  Pulse: 85 86 95 86  Resp:   18 18  Temp:   97.9 F (36.6 C) 97.8 F (36.6 C)  TempSrc:   Oral Oral  SpO2:      Weight:      Height:       General: alert, cooperative and no distress Lochia: appropriate Uterine Fundus: firm Incision: N/A DVT Evaluation: No evidence of DVT seen on physical exam. No cords or calf tenderness. No significant calf/ankle edema. Labs: Lab Results  Component Value Date   WBC 11.4 (H) 02/21/2016   HGB 10.9 (L)  02/21/2016   HCT 33.4 (L) 02/21/2016   MCV 78.4 02/21/2016   PLT 258 02/21/2016   CMP Latest Ref Rng & Units 02/20/2016  Glucose 65 - 99 mg/dL 782(N)  BUN 6 - 20 mg/dL 7  Creatinine 5.62 - 1.30 mg/dL 8.65  Sodium 784 - 696 mmol/L 135  Potassium 3.5 - 5.1 mmol/L 3.9  Chloride 101 - 111 mmol/L 106  CO2 22 - 32 mmol/L 23  Calcium 8.9 - 10.3 mg/dL 9.0  Total Protein 6.5 - 8.1 g/dL 6.2(L)  Total Bilirubin 0.3 - 1.2 mg/dL 0.5  Alkaline Phos 38 - 126 U/L 153(H)  AST 15 - 41 U/L 17  ALT 14 - 54 U/L 16    Discharge instruction: per After Visit Summary and "Baby and Me Booklet".  After visit meds:    Medication List    TAKE  these medications   hydrocortisone-pramoxine rectal foam Commonly known as:  PROCTOFOAM HC Place 1 applicator rectally 2 (two) times daily.   ibuprofen 600 MG tablet Commonly known as:  ADVIL,MOTRIN Take 1 tablet (600 mg total) by mouth every 6 (six) hours.   phenylephrine-shark liver oil-mineral oil-petrolatum 0.25-3-14-71.9 % rectal ointment Commonly known as:  PREPARATION H Place 1 application rectally 2 (two) times daily as needed for hemorrhoids.   prenatal multivitamin Tabs tablet Take 2 tablets by mouth daily at 12 noon.       Diet: routine diet  Activity: Advance as tolerated. Pelvic rest for 6 weeks.   Outpatient follow up:2 weeks on blood pressure (clinic pool messaged), and 6 weeks postpartum  Follow up Appt:No future appointments. Follow up Visit:No Follow-up on file.  Postpartum contraception: Condoms  Newborn Data: Live born female  Birth Weight: 8 lb 4.3 oz (3750 g) APGAR: 9, 10  Baby Feeding: Bottle Disposition:home with mother   02/23/2016 Frederik PearJulie P Degele, MD   CNM attestation I have seen and examined this patient and agree with above documentation in the resident's note.   Rachel Ritter is a 25 y.o. G1P1001 s/p SVD.   Pain is well controlled.  Plan for birth control is condoms.  Method of Feeding: bottle  PE:  BP (!)  138/91 (BP Location: Left Arm)   Pulse 86   Temp 97.8 F (36.6 C) (Oral)   Resp 18   Ht 5\' 5"  (1.651 m)   Wt 116.9 kg (257 lb 12.8 oz)   SpO2 100%   Breastfeeding? Unknown   BMI 42.90 kg/m  Fundus firm   Recent Labs  02/27/16 0539  HGB 11.6*  HCT 35.9*     Plan: discharge today - postpartum care discussed - f/u clinic in 2 weeks in office for BP check   Ritter, Rachel, CNM 3:03 PM

## 2016-02-25 ENCOUNTER — Encounter (HOSPITAL_COMMUNITY): Payer: Self-pay | Admitting: Emergency Medicine

## 2016-02-25 ENCOUNTER — Emergency Department (HOSPITAL_COMMUNITY)
Admission: EM | Admit: 2016-02-25 | Discharge: 2016-02-25 | Disposition: A | Discharge status: Home / Self Care | Payer: Medicaid Other | Source: Home / Self Care

## 2016-02-25 ENCOUNTER — Inpatient Hospital Stay (HOSPITAL_COMMUNITY)
Admission: AD | Admit: 2016-02-25 | Discharge: 2016-02-29 | DRG: 776 | Disposition: A | Discharge status: Home / Self Care | Payer: Medicaid Other | Source: Ambulatory Visit | Attending: Family Medicine | Admitting: Family Medicine

## 2016-02-25 ENCOUNTER — Emergency Department (HOSPITAL_COMMUNITY): Payer: Medicaid Other

## 2016-02-25 DIAGNOSIS — O1495 Unspecified pre-eclampsia, complicating the puerperium: Secondary | ICD-10-CM | POA: Diagnosis present

## 2016-02-25 DIAGNOSIS — Z5321 Procedure and treatment not carried out due to patient leaving prior to being seen by health care provider: Secondary | ICD-10-CM | POA: Insufficient documentation

## 2016-02-25 DIAGNOSIS — O1415 Severe pre-eclampsia, complicating the puerperium: Principal | ICD-10-CM | POA: Diagnosis present

## 2016-02-25 DIAGNOSIS — R0602 Shortness of breath: Secondary | ICD-10-CM | POA: Insufficient documentation

## 2016-02-25 LAB — CBC WITH DIFFERENTIAL/PLATELET
BASOS ABS: 0 10*3/uL (ref 0.0–0.1)
BASOS PCT: 0 %
EOS ABS: 0.1 10*3/uL (ref 0.0–0.7)
EOS PCT: 2 %
HCT: 33.5 % — ABNORMAL LOW (ref 36.0–46.0)
Hemoglobin: 10 g/dL — ABNORMAL LOW (ref 12.0–15.0)
Lymphocytes Relative: 26 %
Lymphs Abs: 2.1 10*3/uL (ref 0.7–4.0)
MCH: 24.4 pg — ABNORMAL LOW (ref 26.0–34.0)
MCHC: 29.9 g/dL — ABNORMAL LOW (ref 30.0–36.0)
MCV: 81.9 fL (ref 78.0–100.0)
MONO ABS: 0.4 10*3/uL (ref 0.1–1.0)
Monocytes Relative: 5 %
Neutro Abs: 5.5 10*3/uL (ref 1.7–7.7)
Neutrophils Relative %: 67 %
PLATELETS: 271 10*3/uL (ref 150–400)
RBC: 4.09 MIL/uL (ref 3.87–5.11)
RDW: 17.5 % — AB (ref 11.5–15.5)
WBC: 8.2 10*3/uL (ref 4.0–10.5)

## 2016-02-25 LAB — BASIC METABOLIC PANEL
ANION GAP: 10 (ref 5–15)
BUN: 11 mg/dL (ref 6–20)
CALCIUM: 8.6 mg/dL — AB (ref 8.9–10.3)
CO2: 18 mmol/L — ABNORMAL LOW (ref 22–32)
CREATININE: 0.78 mg/dL (ref 0.44–1.00)
Chloride: 110 mmol/L (ref 101–111)
Glucose, Bld: 96 mg/dL (ref 65–99)
Potassium: 4 mmol/L (ref 3.5–5.1)
SODIUM: 138 mmol/L (ref 135–145)

## 2016-02-25 NOTE — ED Triage Notes (Signed)
Pt. reports SOB onset Sunday denies cough or congestion , no fever or chills. Recent vaginal delivery 02/21/2016. Denies SOB at arrival .

## 2016-02-26 ENCOUNTER — Encounter (HOSPITAL_COMMUNITY): Payer: Self-pay | Admitting: *Deleted

## 2016-02-26 ENCOUNTER — Encounter (HOSPITAL_COMMUNITY): Payer: Self-pay | Admitting: Anesthesiology

## 2016-02-26 DIAGNOSIS — Z3A38 38 weeks gestation of pregnancy: Secondary | ICD-10-CM | POA: Diagnosis not present

## 2016-02-26 DIAGNOSIS — Z880 Allergy status to penicillin: Secondary | ICD-10-CM | POA: Diagnosis not present

## 2016-02-26 DIAGNOSIS — O1415 Severe pre-eclampsia, complicating the puerperium: Secondary | ICD-10-CM | POA: Diagnosis present

## 2016-02-26 DIAGNOSIS — R6883 Chills (without fever): Secondary | ICD-10-CM | POA: Diagnosis present

## 2016-02-26 DIAGNOSIS — O9122 Nonpurulent mastitis associated with the puerperium: Secondary | ICD-10-CM | POA: Diagnosis not present

## 2016-02-26 DIAGNOSIS — O135 Gestational [pregnancy-induced] hypertension without significant proteinuria, complicating the puerperium: Secondary | ICD-10-CM | POA: Diagnosis not present

## 2016-02-26 DIAGNOSIS — O1495 Unspecified pre-eclampsia, complicating the puerperium: Secondary | ICD-10-CM | POA: Diagnosis not present

## 2016-02-26 LAB — URINALYSIS, ROUTINE W REFLEX MICROSCOPIC
Bilirubin Urine: NEGATIVE
Glucose, UA: NEGATIVE mg/dL
Ketones, ur: NEGATIVE mg/dL
NITRITE: NEGATIVE
PROTEIN: NEGATIVE mg/dL
Specific Gravity, Urine: <1.005 — ABNORMAL LOW (ref 1.005–1.030)
pH: 6 (ref 5.0–8.0)

## 2016-02-26 LAB — COMPREHENSIVE METABOLIC PANEL
ALBUMIN: 2.8 g/dL — AB (ref 3.5–5.0)
ALBUMIN: 2.8 g/dL — AB (ref 3.5–5.0)
ALK PHOS: 107 U/L (ref 38–126)
ALT: 58 U/L — AB (ref 14–54)
ALT: 74 U/L — AB (ref 14–54)
ANION GAP: 7 (ref 5–15)
ANION GAP: 7 (ref 5–15)
AST: 74 U/L — ABNORMAL HIGH (ref 15–41)
AST: 83 U/L — ABNORMAL HIGH (ref 15–41)
Alkaline Phosphatase: 111 U/L (ref 38–126)
BILIRUBIN TOTAL: 0.5 mg/dL (ref 0.3–1.2)
BUN: 12 mg/dL (ref 6–20)
BUN: 10 mg/dL (ref 6–20)
CALCIUM: 8.6 mg/dL — AB (ref 8.9–10.3)
CHLORIDE: 110 mmol/L (ref 101–111)
CO2: 22 mmol/L (ref 22–32)
CO2: 22 mmol/L (ref 22–32)
CREATININE: 0.6 mg/dL (ref 0.44–1.00)
CREATININE: 0.71 mg/dL (ref 0.44–1.00)
Calcium: 8.8 mg/dL — ABNORMAL LOW (ref 8.9–10.3)
Chloride: 110 mmol/L (ref 101–111)
GFR calc Af Amer: >60 mL/min (ref >60)
GFR calc non Af Amer: >60 mL/min (ref >60)
GFR calc non Af Amer: >60 mL/min (ref >60)
GLUCOSE: 100 mg/dL — AB (ref 65–99)
GLUCOSE: 85 mg/dL (ref 65–99)
Potassium: 4.5 mmol/L (ref 3.5–5.1)
Potassium: 3.8 mmol/L (ref 3.5–5.1)
SODIUM: 139 mmol/L (ref 135–145)
Sodium: 139 mmol/L (ref 135–145)
TOTAL PROTEIN: 6 g/dL — AB (ref 6.5–8.1)
Total Bilirubin: 0.4 mg/dL (ref 0.3–1.2)
Total Protein: 5.8 g/dL — ABNORMAL LOW (ref 6.5–8.1)

## 2016-02-26 LAB — URINE MICROSCOPIC-ADD ON

## 2016-02-26 LAB — CBC
HEMATOCRIT: 31.8 % — AB (ref 36.0–46.0)
HEMOGLOBIN: 10.3 g/dL — AB (ref 12.0–15.0)
MCH: 25.4 pg — ABNORMAL LOW (ref 26.0–34.0)
MCHC: 32.4 g/dL (ref 30.0–36.0)
MCV: 78.3 fL (ref 78.0–100.0)
Platelets: 296 10*3/uL (ref 150–400)
RBC: 4.06 MIL/uL (ref 3.87–5.11)
RDW: 17.6 % — ABNORMAL HIGH (ref 11.5–15.5)
WBC: 7.9 10*3/uL (ref 4.0–10.5)

## 2016-02-26 LAB — PROTEIN / CREATININE RATIO, URINE
Creatinine, Urine: 44 mg/dL
PROTEIN CREATININE RATIO: 0.2 mg/mg{creat} — AB (ref 0.00–0.15)
Total Protein, Urine: 9 mg/dL

## 2016-02-26 MED ORDER — ACETAMINOPHEN 325 MG PO TABS: 650.0000 mg | ORAL_TABLET | ORAL | Status: DC | PRN

## 2016-02-26 MED ORDER — HYDRALAZINE HCL 20 MG/ML IJ SOLN
5.0000 mg | INTRAMUSCULAR | Status: AC | PRN
  Administered 2016-02-26: 10 mg via INTRAVENOUS
  Administered 2016-02-26: 5 mg via INTRAVENOUS
  Filled 2016-02-26: qty 1

## 2016-02-26 MED ORDER — CALCIUM CARBONATE ANTACID 500 MG PO CHEW: 2.0000 | CHEWABLE_TABLET | ORAL | Status: DC | PRN

## 2016-02-26 MED ORDER — NIFEDIPINE 10 MG PO CAPS
10.0000 mg | ORAL_CAPSULE | Freq: Once | ORAL | Status: AC
Start: 2016-02-26 — End: 2016-02-26
  Administered 2016-02-26: 10 mg via ORAL
  Filled 2016-02-26: qty 1

## 2016-02-26 MED ORDER — LACTATED RINGERS IV SOLN
INTRAVENOUS | Status: DC
  Administered 2016-02-26 (×3): via INTRAVENOUS

## 2016-02-26 MED ORDER — NIFEDIPINE ER OSMOTIC RELEASE 30 MG PO TB24: 60.0000 mg | ORAL_TABLET | Freq: Every day | ORAL | Status: DC

## 2016-02-26 MED ORDER — PRENATAL MULTIVITAMIN CH
1.0000 | ORAL_TABLET | Freq: Every day | ORAL | Status: DC
  Administered 2016-02-26 – 2016-02-29 (×4): 1 via ORAL
  Filled 2016-02-26 (×4): qty 1

## 2016-02-26 MED ORDER — ZOLPIDEM TARTRATE 5 MG PO TABS: 5.0000 mg | ORAL_TABLET | Freq: Every evening | ORAL | Status: DC | PRN

## 2016-02-26 MED ORDER — LABETALOL HCL 5 MG/ML IV SOLN: 20.0000 mg | INTRAVENOUS | Status: DC | PRN

## 2016-02-26 MED ORDER — MAGNESIUM SULFATE BOLUS VIA INFUSION
4.0000 g | Freq: Once | INTRAVENOUS | Status: AC
  Administered 2016-02-26: 4 g via INTRAVENOUS
  Filled 2016-02-26: qty 500

## 2016-02-26 MED ORDER — NIFEDIPINE ER OSMOTIC RELEASE 30 MG PO TB24
60.0000 mg | ORAL_TABLET | Freq: Every day | ORAL | Status: DC
  Administered 2016-02-26 – 2016-02-29 (×4): 60 mg via ORAL
  Filled 2016-02-26 (×4): qty 2

## 2016-02-26 MED ORDER — MAGNESIUM SULFATE 50 % IJ SOLN
2.0000 g/h | INTRAVENOUS | Status: AC
  Administered 2016-02-26: 2 g/h via INTRAVENOUS
  Filled 2016-02-26 (×2): qty 80

## 2016-02-26 MED ORDER — DOCUSATE SODIUM 100 MG PO CAPS
100.0000 mg | ORAL_CAPSULE | Freq: Every day | ORAL | Status: DC
Start: 2016-02-26 — End: 2016-02-29
  Administered 2016-02-26 – 2016-02-29 (×4): 100 mg via ORAL
  Filled 2016-02-26 (×4): qty 1

## 2016-02-26 NOTE — H&P (Signed)
ANTEPARTUM ADMISSION HISTORY AND PHYSICAL NOTE   History of Present Illness: Rachel Ritter is a 25 y.o. G1P1 who is 5 days post partum from normal vaginal delivery after induction for gHTN. She required no medications or magnesium to control her blood pressure through labor or postpartum.   She reports that yesterday she developed some shortness of breath which worsened today. She reports when the baby sits on her chest she feels as though she can't catch her breath. She denies headache, blurred vision or RUQ pain. She reports no chest pain.   Pt does report that [place], but did not want to wait so she came here.   Patient Active Problem List   Diagnosis Date Noted  . Pre-eclampsia in postpartum period 02/26/2016  . Status post vaginal delivery 02/23/2016  . Gestational hypertension 02/20/2016  . Hemorrhoids during pregnancy in third trimester 02/13/2016  . Supervision of normal pregnancy 02/13/2016  . Large for gestational age fetus affecting mother, antepartum 02/06/2016  . Obesity during pregnancy in second trimester 10/03/2015  . Supervision of normal first pregnancy in second trimester 09/05/2015    Past Medical History:  Diagnosis Date  . Medical history non-contributory     Past Surgical History:  Procedure Laterality Date  . NO PAST SURGERIES      OB History  Gravida Para Term Preterm AB Living  1 1 1     1   SAB TAB Ectopic Multiple Live Births        0 1    # Outcome Date GA Lbr Len/2nd Weight Sex Delivery Anes PTL Lv  1 Term 02/21/16 [redacted]w[redacted]d / 00:47 8 lb 4.3 oz (3.75 kg) M Vag-Spont EPI  LIV      Social History   Social History  . Marital status: Single    Spouse name: N/A  . Number of children: N/A  . Years of education: N/A   Social History Main Topics  . Smoking status: Never Smoker  . Smokeless tobacco: Never Used  . Alcohol use No  . Drug use: No  . Sexual activity: No   Other Topics Concern  . Not on file   Social  History Narrative  . No narrative on file    Family History  Problem Relation Age of Onset  . Hypertension Mother   . Hypertension Sister   . Hypertension Maternal Aunt   . Hypertension Maternal Grandfather   . Diabetes Paternal Grandmother   . Stroke Paternal Grandmother     Allergies  Allergen Reactions  . Amoxicillin Hives    Has patient had a PCN reaction causing immediate rash, facial/tongue/throat swelling, SOB or lightheadedness with hypotension: No Has patient had a PCN reaction causing severe rash involving mucus membranes or skin necrosis: No Has patient had a PCN reaction that required hospitalization No Has patient had a PCN reaction occurring within the last 10 years: Yes If all of the above answers are "NO", then may proceed with Cephalosporin use.     Prescriptions Prior to Admission  Medication Sig Dispense Refill Last Dose  . hydrocortisone-pramoxine (PROCTOFOAM HC) rectal foam Place 1 applicator rectally 2 (two) times daily. 10 g 0 02/26/2016  . ibuprofen (ADVIL,MOTRIN) 600 MG tablet Take 1 tablet (600 mg total) by mouth every 6 (six) hours. 60 tablet 0 02/26/2016  . phenylephrine-shark liver oil-mineral oil-petrolatum (PREPARATION H) 0.25-3-14-71.9 % rectal ointment Place 1 application rectally 2 (two) times daily as needed for hemorrhoids.   02/26/2016  . Prenatal  Vit-Fe Fumarate-FA (PRENATAL MULTIVITAMIN) TABS tablet Take 2 tablets by mouth daily at 12 noon.   02/26/2016    Review of Systems  Constitutional: Negative for chills and fever.  HENT: Negative for congestion.   Eyes: Negative for blurred vision and double vision.  Respiratory: Negative for cough and shortness of breath.   Cardiovascular: Negative for chest pain and palpitations.  Gastrointestinal: Negative for abdominal pain, diarrhea, heartburn, nausea and vomiting.  Genitourinary: Negative for dysuria and urgency.  Musculoskeletal: Negative for myalgias and neck pain.  Skin: Negative for itching  and rash.  Neurological: Negative for dizziness, tingling and headaches.  Endo/Heme/Allergies: Negative for environmental allergies. Does not bruise/bleed easily.     Vitals:  BP (!) 163/103   Pulse 73   Temp 98.3 F (36.8 C) (Oral)   Resp 18   Ht 5\' 4"  (1.626 m)   Wt 247 lb (112 kg)   SpO2 99%   BMI 42.40 kg/m  Physical Examination: CONSTITUTIONAL: Well-developed, well-nourished female in no acute distress.  HENT:  Normocephalic, atraumatic, External right and left ear normal. Oropharynx is clear and moist EYES: Conjunctivae and EOM are normal. Pupils are equal, round, and reactive to light. No scleral icterus.  NECK: Normal range of motion, supple, no masses SKIN: Skin is warm and dry. No rash noted. Not diaphoretic. No erythema. No pallor. NEUROLGIC: Alert and oriented to person, place, and time. 3-4+ patellar reflexes b/l. 2-3 beats of clonus.  PSYCHIATRIC: Normal mood and affect. Normal behavior. Normal judgment and thought content. CARDIOVASCULAR: Normal heart rate noted, regular rhythm RESPIRATORY: Effort and breath sounds normal, no problems with respiration noted ABDOMEN: Soft, nontender, nondistended, gravid. MUSCULOSKELETAL: Normal range of motion. No edema and no tenderness. 2+ distal pulses.  Labs:  Results for orders placed or performed during the hospital encounter of 02/25/16 (from the past 24 hour(s))  Protein / creatinine ratio, urine   Collection Time: 02/25/16 11:25 PM  Result Value Ref Range   Creatinine, Urine 44.00 mg/dL   Total Protein, Urine 9 mg/dL   Protein Creatinine Ratio 0.20 (H) 0.00 - 0.15 mg/mg[Cre]  Urinalysis, Routine w reflex microscopic (not at [facility])   Collection Time: 02/25/16 11:25 PM  Result Value Ref Range   Color, Urine YELLOW YELLOW   APPearance CLEAR CLEAR   Specific Gravity, Urine <1.005 (L) 1.005 - 1.030   pH 6.0 5.0 - 8.0   Glucose, UA NEGATIVE NEGATIVE mg/dL   Hgb urine dipstick LARGE (A) NEGATIVE   Bilirubin Urine NEGATIVE  NEGATIVE   Ketones, ur NEGATIVE NEGATIVE mg/dL   Protein, ur NEGATIVE NEGATIVE mg/dL   Nitrite NEGATIVE NEGATIVE   Leukocytes, UA MODERATE (A) NEGATIVE  Urine microscopic-add on   Collection Time: 02/25/16 11:25 PM  Result Value Ref Range   Squamous Epithelial / LPF 0-5 (A) NONE SEEN   WBC, UA 6-30 0 - 5 WBC/hpf   RBC / HPF 6-30 0 - 5 RBC/hpf   Bacteria, UA FEW (A) NONE SEEN  Comprehensive metabolic panel   Collection Time: 02/26/16 12:28 AM  Result Value Ref Range   Sodium 139 135 - 145 mmol/L   Potassium 4.5 3.5 - 5.1 mmol/L   Chloride 110 101 - 111 mmol/L   CO2 22 22 - 32 mmol/L   Glucose, Bld 100 (H) 65 - 99 mg/dL   BUN 12 6 - 20 mg/dL   Creatinine, Ser 1.61 0.44 - 1.00 mg/dL   Calcium 8.8 (L) 8.9 - 10.3 mg/dL   Total Protein 5.8 (L)  6.5 - 8.1 g/dL   Albumin 2.8 (L) 3.5 - 5.0 g/dL   AST 74 (H) 15 - 41 U/L   ALT 58 (H) 14 - 54 U/L   Alkaline Phosphatase 111 38 - 126 U/L   Total Bilirubin 0.4 0.3 - 1.2 mg/dL   GFR calc non Af Amer >60 >60 mL/min   GFR calc Af Amer >60 >60 mL/min   Anion gap 7 5 - 15  Results for orders placed or performed during the hospital encounter of 02/25/16 (from the past 24 hour(s))  CBC with Differential   Collection Time: 02/25/16  8:54 PM  Result Value Ref Range   WBC 8.2 4.0 - 10.5 K/uL   RBC 4.09 3.87 - 5.11 MIL/uL   Hemoglobin 10.0 (L) 12.0 - 15.0 g/dL   HCT 16.133.5 (L) 09.636.0 - 04.546.0 %   MCV 81.9 78.0 - 100.0 fL   MCH 24.4 (L) 26.0 - 34.0 pg   MCHC 29.9 (L) 30.0 - 36.0 g/dL   RDW 40.917.5 (H) 81.111.5 - 91.415.5 %   Platelets 271 150 - 400 K/uL   Neutrophils Relative % 67 %   Neutro Abs 5.5 1.7 - 7.7 K/uL   Lymphocytes Relative 26 %   Lymphs Abs 2.1 0.7 - 4.0 K/uL   Monocytes Relative 5 %   Monocytes Absolute 0.4 0.1 - 1.0 K/uL   Eosinophils Relative 2 %   Eosinophils Absolute 0.1 0.0 - 0.7 K/uL   Basophils Relative 0 %   Basophils Absolute 0.0 0.0 - 0.1 K/uL  Basic metabolic panel   Collection Time: 02/25/16  8:54 PM  Result Value Ref Range    Sodium 138 135 - 145 mmol/L   Potassium 4.0 3.5 - 5.1 mmol/L   Chloride 110 101 - 111 mmol/L   CO2 18 (L) 22 - 32 mmol/L   Glucose, Bld 96 65 - 99 mg/dL   BUN 11 6 - 20 mg/dL   Creatinine, Ser 7.820.78 0.44 - 1.00 mg/dL   Calcium 8.6 (L) 8.9 - 10.3 mg/dL   GFR calc non Af Amer >60 >60 mL/min   GFR calc Af Amer >60 >60 mL/min   Anion gap 10 5 - 15    Imaging Studies: CXR today: mild cardiomegaly, no pleural effusion or signs of heart failure.   Assessment and Plan: Patient Active Problem List   Diagnosis Date Noted  . Pre-eclampsia in postpartum period 02/26/2016  . Status post vaginal delivery 02/23/2016  . Gestational hypertension 02/20/2016  . Hemorrhoids during pregnancy in third trimester 02/13/2016  . Supervision of normal pregnancy 02/13/2016  . Large for gestational age fetus affecting mother, antepartum 02/06/2016  . Obesity during pregnancy in second trimester 10/03/2015  . Supervision of normal first pregnancy in second trimester 09/05/2015    #1. Post-partum pre-eclampsia: Pt with severe range BP and elevated LFTs in addition to hyperflexia.  -start magnesium 4g bolus and 2g infusion -hydralazine protocol -recheck labs in AM -consider lasix in AM if remains positive with I and O. -start procardia PO in AM for BP control - avoiding amlodipine due to swelling and not using reliable contraception so no ACE.  Ernestina PennaNicholas Zimri Brennen, MD Faculty Practice, [facility]

## 2016-02-26 NOTE — Progress Notes (Addendum)
ANTEPARTUM PROGRESS NOTE  Rachel Ritter is a 25 y.o. G1P1 admitted 5 day post partum for pre-eclampsia with severe features including serve range BP and elevated LFTs.  Length of Stay:  0  Days  Subjective: Pt reporting some mild SOB this AM but improved form yesterday. No headache blurred vission or RUQ pain.  Vitals:  Blood pressure 135/86, pulse 69, temperature 98.3 F (36.8 C), temperature source Oral, resp. rate 18, height 5\' 4"  (1.626 m), weight 247 lb (112 kg), SpO2 90 %, unknown if currently breastfeeding. Physical Examination: Physical Exam  Constitutional: She is well-developed, well-nourished, and in no distress.  HENT:  Head: Normocephalic and atraumatic.  Cardiovascular: Normal rate and regular rhythm.   No murmur heard. Pulmonary/Chest: Effort normal and breath sounds normal. No respiratory distress.  Abdominal: Soft. Bowel sounds are normal. She exhibits no distension. There is no tenderness.  Musculoskeletal: She exhibits edema.  2+ edema B/l lower extremities   Labs:  Results for orders placed or performed during the hospital encounter of 02/25/16 (from the past 24 hour(s))  Protein / creatinine ratio, urine   Collection Time: 02/25/16 11:25 PM  Result Value Ref Range   Creatinine, Urine 44.00 mg/dL   Total Protein, Urine 9 mg/dL   Protein Creatinine Ratio 0.20 (H) 0.00 - 0.15 mg/mg[Cre]  Urinalysis, Routine w reflex microscopic ([facility])   Collection Time: 02/25/16 11:25 PM  Result Value Ref Range   Color, Urine YELLOW YELLOW   APPearance CLEAR CLEAR   Specific Gravity, Urine <1.005 (L) 1.005 - 1.030   pH 6.0 5.0 - 8.0   Glucose, UA NEGATIVE NEGATIVE mg/dL   Hgb urine dipstick LARGE (A) NEGATIVE   Bilirubin Urine NEGATIVE NEGATIVE   Ketones, ur NEGATIVE NEGATIVE mg/dL   Protein, ur NEGATIVE NEGATIVE mg/dL   Nitrite NEGATIVE NEGATIVE   Leukocytes, UA MODERATE (A) NEGATIVE  Urine microscopic-add on   Collection Time: 02/25/16 11:25 PM  Result  Value Ref Range   Squamous Epithelial / LPF 0-5 (A) NONE SEEN   WBC, UA 6-30 0 - 5 WBC/hpf   RBC / HPF 6-30 0 - 5 RBC/hpf   Bacteria, UA FEW (A) NONE SEEN  Comprehensive metabolic panel   Collection Time: 02/26/16 12:28 AM  Result Value Ref Range   Sodium 139 135 - 145 mmol/L   Potassium 4.5 3.5 - 5.1 mmol/L   Chloride 110 101 - 111 mmol/L   CO2 22 22 - 32 mmol/L   Glucose, Bld 100 (H) 65 - 99 mg/dL   BUN 12 6 - 20 mg/dL   Creatinine, Ser 0.980.71 0.44 - 1.00 mg/dL   Calcium 8.8 (L) 8.9 - 10.3 mg/dL   Total Protein 5.8 (L) 6.5 - 8.1 g/dL   Albumin 2.8 (L) 3.5 - 5.0 g/dL   AST 74 (H) 15 - 41 U/L   ALT 58 (H) 14 - 54 U/L   Alkaline Phosphatase 111 38 - 126 U/L   Total Bilirubin 0.4 0.3 - 1.2 mg/dL   GFR calc non Af Amer >60 >60 mL/min   GFR calc Af Amer >60 >60 mL/min   Anion gap 7 5 - 15  CBC   Collection Time: 02/26/16  5:40 AM  Result Value Ref Range   WBC 7.9 4.0 - 10.5 K/uL   RBC 4.06 3.87 - 5.11 MIL/uL   Hemoglobin 10.3 (L) 12.0 - 15.0 g/dL   HCT 11.931.8 (L) 14.736.0 - 82.946.0 %   MCV 78.3 78.0 - 100.0 fL  MCH 25.4 (L) 26.0 - 34.0 pg   MCHC 32.4 30.0 - 36.0 g/dL   RDW 81.1 (H) 91.4 - 78.2 %   Platelets 296 150 - 400 K/uL  Comprehensive metabolic panel   Collection Time: 02/26/16  5:40 AM  Result Value Ref Range   Sodium 139 135 - 145 mmol/L   Potassium 3.8 3.5 - 5.1 mmol/L   Chloride 110 101 - 111 mmol/L   CO2 22 22 - 32 mmol/L   Glucose, Bld 85 65 - 99 mg/dL   BUN 10 6 - 20 mg/dL   Creatinine, Ser 9.56 0.44 - 1.00 mg/dL   Calcium 8.6 (L) 8.9 - 10.3 mg/dL   Total Protein 6.0 (L) 6.5 - 8.1 g/dL   Albumin 2.8 (L) 3.5 - 5.0 g/dL   AST 83 (H) 15 - 41 U/L   ALT 74 (H) 14 - 54 U/L   Alkaline Phosphatase 107 38 - 126 U/L   Total Bilirubin 0.5 0.3 - 1.2 mg/dL   GFR calc non Af Amer >60 >60 mL/min   GFR calc Af Amer >60 >60 mL/min   Anion gap 7 5 - 15  Results for orders placed or performed during the hospital encounter of 02/25/16 (from the past 24 hour(s))  CBC with  Differential   Collection Time: 02/25/16  8:54 PM  Result Value Ref Range   WBC 8.2 4.0 - 10.5 K/uL   RBC 4.09 3.87 - 5.11 MIL/uL   Hemoglobin 10.0 (L) 12.0 - 15.0 g/dL   HCT 21.3 (L) 08.6 - 57.8 %   MCV 81.9 78.0 - 100.0 fL   MCH 24.4 (L) 26.0 - 34.0 pg   MCHC 29.9 (L) 30.0 - 36.0 g/dL   RDW 46.9 (H) 62.9 - 52.8 %   Platelets 271 150 - 400 K/uL   Neutrophils Relative % 67 %   Neutro Abs 5.5 1.7 - 7.7 K/uL   Lymphocytes Relative 26 %   Lymphs Abs 2.1 0.7 - 4.0 K/uL   Monocytes Relative 5 %   Monocytes Absolute 0.4 0.1 - 1.0 K/uL   Eosinophils Relative 2 %   Eosinophils Absolute 0.1 0.0 - 0.7 K/uL   Basophils Relative 0 %   Basophils Absolute 0.0 0.0 - 0.1 K/uL  Basic metabolic panel   Collection Time: 02/25/16  8:54 PM  Result Value Ref Range   Sodium 138 135 - 145 mmol/L   Potassium 4.0 3.5 - 5.1 mmol/L   Chloride 110 101 - 111 mmol/L   CO2 18 (L) 22 - 32 mmol/L   Glucose, Bld 96 65 - 99 mg/dL   BUN 11 6 - 20 mg/dL   Creatinine, Ser 4.13 0.44 - 1.00 mg/dL   Calcium 8.6 (L) 8.9 - 10.3 mg/dL   GFR calc non Af Amer >60 >60 mL/min   GFR calc Af Amer >60 >60 mL/min   Anion gap 10 5 - 15    Medications:  Scheduled . docusate sodium  100 mg Oral Daily  . NIFEdipine  60 mg Oral Daily  . prenatal multivitamin  1 tablet Oral Q1200   I have reviewed the patient's current medications.  ASSESSMENT: Patient Active Problem List   Diagnosis Date Noted  . Pre-eclampsia in postpartum period 02/26/2016  . Status post vaginal delivery 02/23/2016  . Gestational hypertension 02/20/2016  . Hemorrhoids during pregnancy in third trimester 02/13/2016  . Supervision of normal pregnancy 02/13/2016  . Large for gestational age fetus affecting mother, antepartum 02/06/2016  . Obesity during  pregnancy in second trimester 10/03/2015  . Supervision of normal first pregnancy in second trimester 09/05/2015    PLAN: Post partum pre-eclampsia: on Magnesium. Set to D/C at 1AM. Started on Po  procardia 60mg  daily. Continue to monitor BP. Lfts mildly increased Plts stable. Repeat labs in AM.    Pt is negative 1500cc.  Ernestina Penna 02/26/2016,7:45 AM

## 2016-02-26 NOTE — ED Notes (Signed)
Called to reassess vitals. No answer. Times 3 

## 2016-02-26 NOTE — Consult Note (Signed)
Lactation note: Mom readmitted for treatment of preeclampsia at 5 days postpartum.  She states baby has not been latching well at home.  She has been using a hand pump and obtaining small amounts of colostrum.  Baby fed one hour ago with RN assist and is sleeping soundly.  Importance of establishing and maintaining milk supply reviewed with mom. Baby has been supplemented with formula since discharge.  [facility] DEBP set up and initiated.  Instructed mom to continue to attempt to put baby to breast with cues, call for assist prn and pump both breasts every 3 hours x 15-20 minutes.  Mom will give any expressed milk back to baby.  Plan to follow up tomorrow.

## 2016-02-27 ENCOUNTER — Encounter: Payer: Medicaid Other | Admitting: Advanced Practice Midwife

## 2016-02-27 LAB — COMPREHENSIVE METABOLIC PANEL
ALK PHOS: 111 U/L (ref 38–126)
ALT: 105 U/L — AB (ref 14–54)
AST: 73 U/L — ABNORMAL HIGH (ref 15–41)
Albumin: 2.8 g/dL — ABNORMAL LOW (ref 3.5–5.0)
Anion gap: 7 (ref 5–15)
BILIRUBIN TOTAL: 0.5 mg/dL (ref 0.3–1.2)
BUN: 7 mg/dL (ref 6–20)
CALCIUM: 8 mg/dL — AB (ref 8.9–10.3)
CO2: 24 mmol/L (ref 22–32)
CREATININE: 0.64 mg/dL (ref 0.44–1.00)
Chloride: 107 mmol/L (ref 101–111)
GFR calc non Af Amer: >60 mL/min (ref >60)
GLUCOSE: 92 mg/dL (ref 65–99)
Potassium: 3.8 mmol/L (ref 3.5–5.1)
SODIUM: 138 mmol/L (ref 135–145)
TOTAL PROTEIN: 6.1 g/dL — AB (ref 6.5–8.1)

## 2016-02-27 LAB — CBC
HEMATOCRIT: 35.9 % — AB (ref 36.0–46.0)
HEMOGLOBIN: 11.6 g/dL — AB (ref 12.0–15.0)
MCH: 25.2 pg — AB (ref 26.0–34.0)
MCHC: 32.3 g/dL (ref 30.0–36.0)
MCV: 78 fL (ref 78.0–100.0)
Platelets: 306 10*3/uL (ref 150–400)
RBC: 4.6 MIL/uL (ref 3.87–5.11)
RDW: 18 % — ABNORMAL HIGH (ref 11.5–15.5)
WBC: 7.8 10*3/uL (ref 4.0–10.5)

## 2016-02-27 MED ORDER — ENALAPRIL MALEATE 2.5 MG PO TABS
2.5000 mg | ORAL_TABLET | Freq: Every day | ORAL | Status: DC
  Administered 2016-02-27 – 2016-02-28 (×2): 2.5 mg via ORAL
  Filled 2016-02-27 (×2): qty 1

## 2016-02-27 NOTE — Progress Notes (Signed)
Patient ID: CLORINDA WYBLE, female   DOB: 1990/12/25, 25 y.o.   MRN: [id number]   Subjective: Interval History:Magnesium stopped at 1 am. Decent diuresis.  Objective: Vital signs in last 24 hours: Temp:  [98.2 F (36.8 C)-98.6 F (37 C)] 98.3 F (36.8 C) (09/26 2350) Pulse Rate:  [64-111] 101 (09/27 0405) Resp:  [16-18] 16 (09/27 0405) BP: (133-164)/(75-112) 143/93 (09/27 0405) SpO2:  [94 %-99 %] 97 % (09/26 1400)  Intake/Output from previous day: 09/26 0701 - 09/27 0700 In: 4350 [P.O.:2100; I.V.:2250] Out: 78295 [Urine:11550] Intake/Output this shift: No intake/output data recorded.  General appearance: alert, cooperative and appears stated age Lungs: normal effort Abdomen: soft, non-tender; bowel sounds normal; no masses,  no organomegaly Extremities: Homans sign is negative, no sign of DVT Neurologic: Reflexes: 2+ and symmetric  Results for orders placed or performed during the hospital encounter of 02/25/16 (from the past 24 hour(s))  CBC     Status: Abnormal   Collection Time: 02/27/16  5:39 AM  Result Value Ref Range   WBC 7.8 4.0 - 10.5 K/uL   RBC 4.60 3.87 - 5.11 MIL/uL   Hemoglobin 11.6 (L) 12.0 - 15.0 g/dL   HCT 62.1 (L) 30.8 - 65.7 %   MCV 78.0 78.0 - 100.0 fL   MCH 25.2 (L) 26.0 - 34.0 pg   MCHC 32.3 30.0 - 36.0 g/dL   RDW 84.6 (H) 96.2 - 95.2 %   Platelets 306 150 - 400 K/uL  Comprehensive metabolic panel     Status: Abnormal   Collection Time: 02/27/16  5:39 AM  Result Value Ref Range   Sodium 138 135 - 145 mmol/L   Potassium 3.8 3.5 - 5.1 mmol/L   Chloride 107 101 - 111 mmol/L   CO2 24 22 - 32 mmol/L   Glucose, Bld 92 65 - 99 mg/dL   BUN 7 6 - 20 mg/dL   Creatinine, Ser 8.41 0.44 - 1.00 mg/dL   Calcium 8.0 (L) 8.9 - 10.3 mg/dL   Total Protein 6.1 (L) 6.5 - 8.1 g/dL   Albumin 2.8 (L) 3.5 - 5.0 g/dL   AST 73 (H) 15 - 41 U/L   ALT 105 (H) 14 - 54 U/L   Alkaline Phosphatase 111 38 - 126 U/L   Total Bilirubin 0.5 0.3 - 1.2 mg/dL   GFR calc non Af  Amer >60 >60 mL/min   GFR calc Af Amer >60 >60 mL/min   Anion gap 7 5 - 15    Studies/Results: Dg Chest 2 View  Result Date: 02/25/2016 CLINICAL DATA:  Short of breath.  Postpartum EXAM: CHEST  2 VIEW COMPARISON:  None. FINDINGS: Cardiac enlargement without heart failure or edema. Negative for pneumonia or effusion. Lungs are clear. IMPRESSION: Cardiac enlargement.  No heart failure or pneumonia. Electronically Signed   By: Marlan Palau M.D.   On: 02/25/2016 21:51   [place] Mfm Ob Follow Up  Result Date: 02/06/2016 OBSTETRICAL ULTRASOUND: This exam was performed within a [place] [facility] Department. The OB US report was generated in the AS system, and faxed to the ordering physician.  This report is available in the [employer]. See the AS Obstetric US report via the Image Link.   Scheduled Meds: . docusate sodium  100 mg Oral Daily  . NIFEdipine  60 mg Oral Daily  . prenatal multivitamin  1 tablet Oral Q1200   Continuous Infusions: . lactated ringers Stopped (02/27/16 0100)   PRN Meds:acetaminophen, calcium carbonate, labetalol, zolpidem  Assessment/Plan:  Active Problems:   Pre-eclampsia in postpartum period  BP is still not well controlled--will add Vasotec today. Anticipate d/c tomorrow.   LOS: 1 day   Reva Boresanya S Aleshka Corney, MD 02/27/2016 8:43 AM

## 2016-02-27 NOTE — Consult Note (Signed)
Lactation follow up. Mom is pumping every 3 hours and obtaining 50-120 mls each pumping.  Baby is not with her now but will be later in the day.  Encouraged to call with questions/assist prn.

## 2016-02-28 DIAGNOSIS — O1495 Unspecified pre-eclampsia, complicating the puerperium: Secondary | ICD-10-CM

## 2016-02-28 DIAGNOSIS — Z3A38 38 weeks gestation of pregnancy: Secondary | ICD-10-CM

## 2016-02-28 MED ORDER — ENALAPRIL MALEATE 10 MG PO TABS
10.0000 mg | ORAL_TABLET | Freq: Every day | ORAL | Status: DC
  Administered 2016-02-29: 10 mg via ORAL
  Filled 2016-02-28: qty 1

## 2016-02-28 MED ORDER — ENALAPRIL MALEATE 5 MG PO TABS
7.5000 mg | ORAL_TABLET | Freq: Once | ORAL | Status: AC
Start: 2016-02-28 — End: 2016-02-28
  Administered 2016-02-28: 7.5 mg via ORAL
  Filled 2016-02-28: qty 1

## 2016-02-28 NOTE — Progress Notes (Signed)
Patient ID: Rachel Ritter, female   DOB: [birth year], 25 y.o.   MRN: [id number]   Subjective: Denies any headache, visual symptoms or abdominal pain. Was started on Enalapril 2.5 mg yesterday.   Objective: Vital signs in last 24 hours: Temp:  [98.1 F (36.7 C)-99 F (37.2 C)] 98.5 F (36.9 C) (09/28 1232) Pulse Rate:  [71-96] 85 (09/28 1302) Resp:  [16-18] 18 (09/28 1247) BP: (140-163)/(91-103) 158/94 (09/28 1302) SpO2:  [99 %] 99 % (09/28 0710) Weight:  [225 lb 12.8 oz (102.4 kg)] 225 lb 12.8 oz (102.4 kg) (09/28 0749)  Vitals:   02/28/16 1005 02/28/16 1232 02/28/16 1247 02/28/16 1302  BP: (!) 151/103 (!) 161/97 (!) 163/95 (!) 158/94  Pulse: 86 85 80 85  Resp:  18 18   Temp:  98.5 F (36.9 C)    TempSrc:  Oral    SpO2:      Weight:      Height:         Intake/Output from previous day: No intake/output data recorded. Intake/Output this shift: No intake/output data recorded.  General appearance: alert, cooperative and appears stated age Lungs: normal effort Abdomen: soft, non-tender; bowel sounds normal; no masses,  no organomegaly Extremities: Homans sign is negative, no sign of DVT Neurologic: Reflexes: 2+ and symmetric  No results found for this or any previous visit (from the past 24 hour(s)).  Studies/Results: Dg Chest 2 View  Result Date: 02/25/2016 CLINICAL DATA:  Short of breath.  Postpartum EXAM: CHEST  2 VIEW COMPARISON:  None. FINDINGS: Cardiac enlargement without heart failure or edema. Negative for pneumonia or effusion. Lungs are clear. IMPRESSION: Cardiac enlargement.  No heart failure or pneumonia. Electronically Signed   By: Marlan Palauharles  Clark M.D.   On: 02/25/2016 21:51   [place] Mfm Ob Follow Up  Result Date: 02/06/2016 OBSTETRICAL ULTRASOUND: This exam was performed within a [place] [facility] Department. The OB US report was generated in the AS system, and faxed to the ordering physician.  This report is available in the [employer]. See the AS  Obstetric US report via the Image Link.   Scheduled Meds: . docusate sodium  100 mg Oral Daily  . [START ON 02/29/2016] enalapril  10 mg Oral Daily  . NIFEdipine  60 mg Oral Daily  . prenatal multivitamin  1 tablet Oral Q1200   Continuous Infusions: . lactated ringers Stopped (02/27/16 0100)   PRN Meds:acetaminophen, calcium carbonate, labetalol, zolpidem  Assessment/Plan: Active Problems:   Pre-eclampsia in postpartum period  BP is still not well controlled--increased Vasotec to 10 mg daily today. Anticipate d/c tomorrow if stable.   LOS: 2 days   ANYANWU,UGONNA A, MD 02/28/2016 1:06 PM

## 2016-02-29 ENCOUNTER — Inpatient Hospital Stay (HOSPITAL_COMMUNITY)
Admission: AD | Admit: 2016-02-29 | Discharge: 2016-03-01 | Disposition: A | Discharge status: Home / Self Care | Payer: Medicaid Other | Source: Ambulatory Visit | Attending: Obstetrics and Gynecology | Admitting: Obstetrics and Gynecology

## 2016-02-29 ENCOUNTER — Encounter (HOSPITAL_COMMUNITY): Payer: Self-pay

## 2016-02-29 DIAGNOSIS — Z880 Allergy status to penicillin: Secondary | ICD-10-CM | POA: Insufficient documentation

## 2016-02-29 DIAGNOSIS — O9122 Nonpurulent mastitis associated with the puerperium: Secondary | ICD-10-CM | POA: Diagnosis not present

## 2016-02-29 DIAGNOSIS — O135 Gestational [pregnancy-induced] hypertension without significant proteinuria, complicating the puerperium: Secondary | ICD-10-CM | POA: Insufficient documentation

## 2016-02-29 DIAGNOSIS — O165 Unspecified maternal hypertension, complicating the puerperium: Secondary | ICD-10-CM

## 2016-02-29 HISTORY — DX: Gestational (pregnancy-induced) hypertension without significant proteinuria, unspecified trimester: O13.9

## 2016-02-29 LAB — URINALYSIS, ROUTINE W REFLEX MICROSCOPIC
BILIRUBIN URINE: NEGATIVE
Glucose, UA: NEGATIVE mg/dL
Ketones, ur: NEGATIVE mg/dL
NITRITE: NEGATIVE
PH: 6 (ref 5.0–8.0)
Protein, ur: NEGATIVE mg/dL
Specific Gravity, Urine: <1.005 — ABNORMAL LOW (ref 1.005–1.030)

## 2016-02-29 LAB — COMPREHENSIVE METABOLIC PANEL
ALBUMIN: 3.2 g/dL — AB (ref 3.5–5.0)
ALK PHOS: 107 U/L (ref 38–126)
ALT: 40 U/L (ref 14–54)
AST: 18 U/L (ref 15–41)
Anion gap: 10 (ref 5–15)
BILIRUBIN TOTAL: 0.5 mg/dL (ref 0.3–1.2)
BUN: 10 mg/dL (ref 6–20)
CALCIUM: 9 mg/dL (ref 8.9–10.3)
CO2: 21 mmol/L — AB (ref 22–32)
CREATININE: 0.74 mg/dL (ref 0.44–1.00)
Chloride: 104 mmol/L (ref 101–111)
GFR calc Af Amer: >60 mL/min (ref >60)
GFR calc non Af Amer: >60 mL/min (ref >60)
GLUCOSE: 99 mg/dL (ref 65–99)
Potassium: 4 mmol/L (ref 3.5–5.1)
SODIUM: 135 mmol/L (ref 135–145)
TOTAL PROTEIN: 6 g/dL — AB (ref 6.5–8.1)

## 2016-02-29 LAB — URINE MICROSCOPIC-ADD ON

## 2016-02-29 LAB — PROTEIN / CREATININE RATIO, URINE
Creatinine, Urine: 55 mg/dL
Total Protein, Urine: <6 mg/dL

## 2016-02-29 LAB — CBC
HEMATOCRIT: 38.2 % (ref 36.0–46.0)
HEMOGLOBIN: 12.4 g/dL (ref 12.0–15.0)
MCH: 25.5 pg — ABNORMAL LOW (ref 26.0–34.0)
MCHC: 32.5 g/dL (ref 30.0–36.0)
MCV: 78.6 fL (ref 78.0–100.0)
Platelets: 349 10*3/uL (ref 150–400)
RBC: 4.86 MIL/uL (ref 3.87–5.11)
RDW: 17.7 % — ABNORMAL HIGH (ref 11.5–15.5)
WBC: 11.8 10*3/uL — ABNORMAL HIGH (ref 4.0–10.5)

## 2016-02-29 MED ORDER — ENALAPRIL MALEATE 10 MG PO TABS: 10.0000 mg | ORAL_TABLET | Freq: Every day | ORAL | 3 refills | Status: DC

## 2016-02-29 MED ORDER — NIFEDIPINE ER 60 MG PO TB24: 60.0000 mg | ORAL_TABLET | Freq: Every day | ORAL | 3 refills | Status: DC

## 2016-02-29 NOTE — MAU Note (Signed)
Was induced last 9/21 due to Preeclampsia. Just went home this am. Breast felt heavy so I pumped and then started having chills. Took shower and started aching all over. Breasts tender all over but no redness. No headaches or blurry vision. Was on Magnesium after delivery

## 2016-02-29 NOTE — Discharge Instructions (Signed)

## 2016-02-29 NOTE — Discharge Summary (Signed)
Antenatal Physician Discharge Summary  Patient ID: Rachel Ritter MRN: [id number] DOB/AGE: 24-19-1992 24 y.o.  Admit date: 02/25/2016 Discharge date: 02/29/2016  Admission Diagnoses: Postpartum severe preeclampsia  Discharge Diagnoses: The same  Hospital Course:  This is a 25 y.o. G1P1001 admitted on PPD#5 for severe postpartum preeclampsia. She was previously induced for [facility]; required no medications or magnesium to control her blood pressure through labor or postpartum.  She was started on magnesium sulfate for eclampsia prophylaxis. She was started on antihypertensive medications which had to be modified to control her BP. At the time of discharge, her BP was controlled on Procardia XL 60  And Enalapril 10 mg daily.  She was deemed stable for discharge to home with outpatient follow up.  Discharge Exam: Temp:  [98.5 F (36.9 C)-98.7 F (37.1 C)] 98.6 F (37 C) (09/29 0436) Pulse Rate:  [74-91] 91 (09/29 0436) Resp:  [16-18] 16 (09/29 0436) BP: (126-163)/(81-103) 128/85 (09/29 0436) SpO2:  [100 %] 100 % (09/29 0436) Weight:  [225 lb 12.8 oz (102.4 kg)] 225 lb 12.8 oz (102.4 kg) (09/28 0749)  Vitals:   02/28/16 1302 02/28/16 1731 02/28/16 2101 02/29/16 0436  BP: (!) 158/94 126/85 131/81 128/85  Pulse: 85 83 80 91  Resp:  18 18 16   Temp:  98.5 F (36.9 C) 98.7 F (37.1 C) 98.6 F (37 C)  TempSrc:  Oral Oral   SpO2:   100% 100%  Weight:      Height:        Physical Examination: CONSTITUTIONAL: Well-developed, well-nourished female in no acute distress.  HENT:  Normocephalic, atraumatic, External right and left ear normal. Oropharynx is clear and moist EYES: Conjunctivae and EOM are normal. Pupils are equal, round, and reactive to light. No scleral icterus.  NECK: Normal range of motion, supple, no masses SKIN: Skin is warm and dry. No rash noted. Not diaphoretic. No erythema. No pallor. NEUROLGIC: Alert and oriented to person, place, and time. Normal reflexes, muscle tone  coordination. No cranial nerve deficit noted. PSYCHIATRIC: Normal mood and affect. Normal behavior. Normal judgment and thought content. CARDIOVASCULAR: Normal heart rate noted, regular rhythm RESPIRATORY: Effort and breath sounds normal, no problems with respiration noted MUSCULOSKELETAL: Normal range of motion. Trace edema and no tenderness. 2+ distal pulses. ABDOMEN: Soft, nontender, nondistended,  Significant Diagnostic Studies:  Results for orders placed or performed during the hospital encounter of 02/25/16 (from the past 168 hour(s))  Protein / creatinine ratio, urine   Collection Time: 02/25/16 11:25 PM  Result Value Ref Range   Creatinine, Urine 44.00 mg/dL   Total Protein, Urine 9 mg/dL   Protein Creatinine Ratio 0.20 (H) 0.00 - 0.15 mg/mg[Cre]  Urinalysis, Routine w reflex microscopic (not at [facility])   Collection Time: 02/25/16 11:25 PM  Result Value Ref Range   Color, Urine YELLOW YELLOW   APPearance CLEAR CLEAR   Specific Gravity, Urine <1.005 (L) 1.005 - 1.030   pH 6.0 5.0 - 8.0   Glucose, UA NEGATIVE NEGATIVE mg/dL   Hgb urine dipstick LARGE (A) NEGATIVE   Bilirubin Urine NEGATIVE NEGATIVE   Ketones, ur NEGATIVE NEGATIVE mg/dL   Protein, ur NEGATIVE NEGATIVE mg/dL   Nitrite NEGATIVE NEGATIVE   Leukocytes, UA MODERATE (A) NEGATIVE  Urine microscopic-add on   Collection Time: 02/25/16 11:25 PM  Result Value Ref Range   Squamous Epithelial / LPF 0-5 (A) NONE SEEN   WBC, UA 6-30 0 - 5 WBC/hpf   RBC / HPF 6-30 0 - 5 RBC/hpf  Bacteria, UA FEW (A) NONE SEEN  Comprehensive metabolic panel   Collection Time: 02/26/16 12:28 AM  Result Value Ref Range   Sodium 139 135 - 145 mmol/L   Potassium 4.5 3.5 - 5.1 mmol/L   Chloride 110 101 - 111 mmol/L   CO2 22 22 - 32 mmol/L   Glucose, Bld 100 (H) 65 - 99 mg/dL   BUN 12 6 - 20 mg/dL   Creatinine, Ser 1.470.71 0.44 - 1.00 mg/dL   Calcium 8.8 (L) 8.9 - 10.3 mg/dL   Total Protein 5.8 (L) 6.5 - 8.1 g/dL   Albumin 2.8 (L) 3.5 - 5.0  g/dL   AST 74 (H) 15 - 41 U/L   ALT 58 (H) 14 - 54 U/L   Alkaline Phosphatase 111 38 - 126 U/L   Total Bilirubin 0.4 0.3 - 1.2 mg/dL   GFR calc non Af Amer >60 >60 mL/min   GFR calc Af Amer >60 >60 mL/min   Anion gap 7 5 - 15  CBC   Collection Time: 02/26/16  5:40 AM  Result Value Ref Range   WBC 7.9 4.0 - 10.5 K/uL   RBC 4.06 3.87 - 5.11 MIL/uL   Hemoglobin 10.3 (L) 12.0 - 15.0 g/dL   HCT 82.931.8 (L) 56.236.0 - 13.046.0 %   MCV 78.3 78.0 - 100.0 fL   MCH 25.4 (L) 26.0 - 34.0 pg   MCHC 32.4 30.0 - 36.0 g/dL   RDW 86.517.6 (H) 78.411.5 - 69.615.5 %   Platelets 296 150 - 400 K/uL  Comprehensive metabolic panel   Collection Time: 02/26/16  5:40 AM  Result Value Ref Range   Sodium 139 135 - 145 mmol/L   Potassium 3.8 3.5 - 5.1 mmol/L   Chloride 110 101 - 111 mmol/L   CO2 22 22 - 32 mmol/L   Glucose, Bld 85 65 - 99 mg/dL   BUN 10 6 - 20 mg/dL   Creatinine, Ser 2.950.60 0.44 - 1.00 mg/dL   Calcium 8.6 (L) 8.9 - 10.3 mg/dL   Total Protein 6.0 (L) 6.5 - 8.1 g/dL   Albumin 2.8 (L) 3.5 - 5.0 g/dL   AST 83 (H) 15 - 41 U/L   ALT 74 (H) 14 - 54 U/L   Alkaline Phosphatase 107 38 - 126 U/L   Total Bilirubin 0.5 0.3 - 1.2 mg/dL   GFR calc non Af Amer >60 >60 mL/min   GFR calc Af Amer >60 >60 mL/min   Anion gap 7 5 - 15  CBC   Collection Time: 02/27/16  5:39 AM  Result Value Ref Range   WBC 7.8 4.0 - 10.5 K/uL   RBC 4.60 3.87 - 5.11 MIL/uL   Hemoglobin 11.6 (L) 12.0 - 15.0 g/dL   HCT 28.435.9 (L) 13.236.0 - 44.046.0 %   MCV 78.0 78.0 - 100.0 fL   MCH 25.2 (L) 26.0 - 34.0 pg   MCHC 32.3 30.0 - 36.0 g/dL   RDW 10.218.0 (H) 72.511.5 - 36.615.5 %   Platelets 306 150 - 400 K/uL  Comprehensive metabolic panel   Collection Time: 02/27/16  5:39 AM  Result Value Ref Range   Sodium 138 135 - 145 mmol/L   Potassium 3.8 3.5 - 5.1 mmol/L   Chloride 107 101 - 111 mmol/L   CO2 24 22 - 32 mmol/L   Glucose, Bld 92 65 - 99 mg/dL   BUN 7 6 - 20 mg/dL   Creatinine, Ser 4.400.64 0.44 - 1.00 mg/dL   Calcium 8.0 (  L) 8.9 - 10.3 mg/dL   Total  Protein 6.1 (L) 6.5 - 8.1 g/dL   Albumin 2.8 (L) 3.5 - 5.0 g/dL   AST 73 (H) 15 - 41 U/L   ALT 105 (H) 14 - 54 U/L   Alkaline Phosphatase 111 38 - 126 U/L   Total Bilirubin 0.5 0.3 - 1.2 mg/dL   GFR calc non Af Amer >60 >60 mL/min   GFR calc Af Amer >60 >60 mL/min   Anion gap 7 5 - 15  Results for orders placed or performed during the hospital encounter of 02/25/16 (from the past 168 hour(s))  CBC with Differential   Collection Time: 02/25/16  8:54 PM  Result Value Ref Range   WBC 8.2 4.0 - 10.5 K/uL   RBC 4.09 3.87 - 5.11 MIL/uL   Hemoglobin 10.0 (L) 12.0 - 15.0 g/dL   HCT 16.1 (L) 09.6 - 04.5 %   MCV 81.9 78.0 - 100.0 fL   MCH 24.4 (L) 26.0 - 34.0 pg   MCHC 29.9 (L) 30.0 - 36.0 g/dL   RDW 40.9 (H) 81.1 - 91.4 %   Platelets 271 150 - 400 K/uL   Neutrophils Relative % 67 %   Neutro Abs 5.5 1.7 - 7.7 K/uL   Lymphocytes Relative 26 %   Lymphs Abs 2.1 0.7 - 4.0 K/uL   Monocytes Relative 5 %   Monocytes Absolute 0.4 0.1 - 1.0 K/uL   Eosinophils Relative 2 %   Eosinophils Absolute 0.1 0.0 - 0.7 K/uL   Basophils Relative 0 %   Basophils Absolute 0.0 0.0 - 0.1 K/uL  Basic metabolic panel   Collection Time: 02/25/16  8:54 PM  Result Value Ref Range   Sodium 138 135 - 145 mmol/L   Potassium 4.0 3.5 - 5.1 mmol/L   Chloride 110 101 - 111 mmol/L   CO2 18 (L) 22 - 32 mmol/L   Glucose, Bld 96 65 - 99 mg/dL   BUN 11 6 - 20 mg/dL   Creatinine, Ser 7.82 0.44 - 1.00 mg/dL   Calcium 8.6 (L) 8.9 - 10.3 mg/dL   GFR calc non Af Amer >60 >60 mL/min   GFR calc Af Amer >60 >60 mL/min   Anion gap 10 5 - 15    Discharge Condition: Stable  Disposition: Home     Medication List    TAKE these medications   enalapril 10 MG tablet Commonly known as:  VASOTEC Take 1 tablet (10 mg total) by mouth daily.   hydrocortisone-pramoxine rectal foam Commonly known as:  PROCTOFOAM HC Place 1 applicator rectally 2 (two) times daily.   ibuprofen 600 MG tablet Commonly known as:   ADVIL,MOTRIN Take 1 tablet (600 mg total) by mouth every 6 (six) hours.   NIFEdipine 60 MG 24 hr tablet Commonly known as:  PROCARDIA-XL/ADALAT CC Take 1 tablet (60 mg total) by mouth daily.   phenylephrine-shark liver oil-mineral oil-petrolatum 0.25-3-14-71.9 % rectal ointment Commonly known as:  PREPARATION H Place 1 application rectally 2 (two) times daily as needed for hemorrhoids.   prenatal multivitamin Tabs tablet Take 2 tablets by mouth daily at 12 noon.      Follow-up Information    [facility] Follow up on 03/07/2016.   Why:  9:15 am for BP check. Call office or go to MAU for any concerns in the meantime. Contact information: [address] [place] [address] [phone]          Signed: Eugene Garnet.D.  02/29/2016, 7:20 AM

## 2016-03-01 DIAGNOSIS — O9122 Nonpurulent mastitis associated with the puerperium: Secondary | ICD-10-CM | POA: Diagnosis not present

## 2016-03-01 DIAGNOSIS — O91219 Nonpurulent mastitis associated with pregnancy, unspecified trimester: Secondary | ICD-10-CM

## 2016-03-01 MED ORDER — ENALAPRIL MALEATE 10 MG PO TABS: 10.0000 mg | ORAL_TABLET | Freq: Two times a day (BID) | ORAL | 1 refills | Status: DC

## 2016-03-01 MED ORDER — CLINDAMYCIN HCL 300 MG PO CAPS: 300.0000 mg | ORAL_CAPSULE | Freq: Three times a day (TID) | ORAL | 0 refills | Status: DC

## 2016-03-01 MED ORDER — FUROSEMIDE 20 MG PO TABS
10.0000 mg | ORAL_TABLET | Freq: Once | ORAL | Status: AC
  Administered 2016-03-01: 10 mg via ORAL
  Filled 2016-03-01: qty 0.5

## 2016-03-01 NOTE — MAU Provider Note (Signed)
Chief Complaint: Chills and Generalized Body Aches   First Provider Initiated Contact with Patient 03/01/16 0107      SUBJECTIVE HPI: Rachel Ritter is a 25 y.o. G1P1001 who presents to maternity admissions reporting chills and body aches with onset immediately after using a breast pump today. She was discharged from [facility] yesterday following readmission for postpartum preeclampsia.  She reports going 12+ hours without nursing her infant before she pumped because the baby was getting formula. She reports she had some pain in both breasts that resolved after pumping and heat from the shower she took after pumping.  She denies breast pain or any other pain currently. She reports normal light lochia and denies abdominal pain. She denies h/a, epigastric pain, or visual disturbances.  She denies vaginal itching/burning, urinary symptoms, dizziness, or n/v.   HPI  Past Medical History:  Diagnosis Date  . Medical history non-contributory   . Pregnancy induced hypertension    Past Surgical History:  Procedure Laterality Date  . NO PAST SURGERIES     Social History   Social History  . Marital status: Single    Spouse name: N/A  . Number of children: N/A  . Years of education: N/A   Occupational History  . Not on file.   Social History Main Topics  . Smoking status: Never Smoker  . Smokeless tobacco: Never Used  . Alcohol use No  . Drug use: No  . Sexual activity: No   Other Topics Concern  . Not on file   Social History Narrative  . No narrative on file   No current facility-administered medications on file prior to encounter.    Current Outpatient Prescriptions on File Prior to Encounter  Medication Sig Dispense Refill  . NIFEdipine (PROCARDIA-XL/ADALAT CC) 60 MG 24 hr tablet Take 1 tablet (60 mg total) by mouth daily. 30 tablet 3  . phenylephrine-shark liver oil-mineral oil-petrolatum (PREPARATION H) 0.25-3-14-71.9 % rectal ointment Place 1 application rectally  2 (two) times daily as needed for hemorrhoids.    . Prenatal Vit-Fe Fumarate-FA (PRENATAL MULTIVITAMIN) TABS tablet Take 2 tablets by mouth daily at 12 noon.     . hydrocortisone-pramoxine (PROCTOFOAM HC) rectal foam Place 1 applicator rectally 2 (two) times daily. 10 g 0  . ibuprofen (ADVIL,MOTRIN) 600 MG tablet Take 1 tablet (600 mg total) by mouth every 6 (six) hours. 60 tablet 0   Allergies  Allergen Reactions  . Amoxicillin Hives    Has patient had a PCN reaction causing immediate rash, facial/tongue/throat swelling, SOB or lightheadedness with hypotension: No Has patient had a PCN reaction causing severe rash involving mucus membranes or skin necrosis: No Has patient had a PCN reaction that required hospitalization No Has patient had a PCN reaction occurring within the last 10 years: Yes If all of the above answers are "NO", then may proceed with Cephalosporin use.     ROS:  Review of Systems  Constitutional: Positive for chills. Negative for fatigue and fever.  Respiratory: Negative for shortness of breath.   Cardiovascular: Negative for chest pain.  Genitourinary: Positive for vaginal bleeding. Negative for difficulty urinating, dysuria, flank pain, pelvic pain, vaginal discharge and vaginal pain.  Musculoskeletal: Positive for myalgias.  Neurological: Negative for dizziness and headaches.  Psychiatric/Behavioral: Negative.      I have reviewed patient's Past Medical Hx, Surgical Hx, Family Hx, Social Hx, medications and allergies.   Physical Exam  Patient Vitals for the past 24 hrs:  BP Temp Temp src Pulse  Resp Height Weight  03/01/16 0117 148/99 98.1 F (36.7 C) Oral 93 16 - -  03/01/16 0100 (!) 150/101 - - 90 - - -  03/01/16 0047 145/92 - - 102 - - -  03/01/16 0030 147/97 - - 88 - - -  03/01/16 0015 144/88 - - 102 - - -  03/01/16 0007 (!) 157/106 - - 96 - - -  02/29/16 2345 (!) 150/101 - - 94 - - -  02/29/16 2330 142/92 - - 88 - - -  02/29/16 2324 145/97 99.1 F  (37.3 C) Oral 89 - - -  02/29/16 2305 150/98 - - 94 - - -  02/29/16 2245 140/98 - - 90 - - -  02/29/16 2230 (!) 151/102 - - 107 16 - -  02/29/16 2215 (!) 155/103 - - - - - -  02/29/16 2212 (!) 154/110 98.8 F (37.1 C) - 113 18 5\' 5"  (1.651 m) 224 lb (101.6 kg)   Constitutional: Well-developed, well-nourished female in no acute distress.  Cardiovascular: normal rate Respiratory: normal effort GI: Abd soft, non-tender. Pos BS x 4 Breast:  Breasts slightly firm bilaterally, no erythema or edema noted MS: Extremities nontender, no edema, normal ROM Neurologic: Alert and oriented x 4.  GU: Neg CVAT.  PELVIC EXAM: Deferred  LAB RESULTS Results for orders placed or performed during the hospital encounter of 02/29/16 (from the past 24 hour(s))  Urinalysis, Routine w reflex microscopic (not at [facility])     Status: Abnormal   Collection Time: 02/29/16 10:30 PM  Result Value Ref Range   Color, Urine YELLOW YELLOW   APPearance CLEAR CLEAR   Specific Gravity, Urine <1.005 (L) 1.005 - 1.030   pH 6.0 5.0 - 8.0   Glucose, UA NEGATIVE NEGATIVE mg/dL   Hgb urine dipstick LARGE (A) NEGATIVE   Bilirubin Urine NEGATIVE NEGATIVE   Ketones, ur NEGATIVE NEGATIVE mg/dL   Protein, ur NEGATIVE NEGATIVE mg/dL   Nitrite NEGATIVE NEGATIVE   Leukocytes, UA MODERATE (A) NEGATIVE  Urine microscopic-add on     Status: Abnormal   Collection Time: 02/29/16 10:30 PM  Result Value Ref Range   Squamous Epithelial / LPF 0-5 (A) NONE SEEN   WBC, UA 6-30 0 - 5 WBC/hpf   RBC / HPF 0-5 0 - 5 RBC/hpf   Bacteria, UA FEW (A) NONE SEEN   Urine-Other MUCOUS PRESENT   CBC     Status: Abnormal   Collection Time: 02/29/16 11:15 PM  Result Value Ref Range   WBC 11.8 (H) 4.0 - 10.5 K/uL   RBC 4.86 3.87 - 5.11 MIL/uL   Hemoglobin 12.4 12.0 - 15.0 g/dL   HCT 16.1 09.6 - 04.5 %   MCV 78.6 78.0 - 100.0 fL   MCH 25.5 (L) 26.0 - 34.0 pg   MCHC 32.5 30.0 - 36.0 g/dL   RDW 40.9 (H) 81.1 - 91.4 %   Platelets 349 150 - 400 K/uL   Comprehensive metabolic panel     Status: Abnormal   Collection Time: 02/29/16 11:15 PM  Result Value Ref Range   Sodium 135 135 - 145 mmol/L   Potassium 4.0 3.5 - 5.1 mmol/L   Chloride 104 101 - 111 mmol/L   CO2 21 (L) 22 - 32 mmol/L   Glucose, Bld 99 65 - 99 mg/dL   BUN 10 6 - 20 mg/dL   Creatinine, Ser 7.82 0.44 - 1.00 mg/dL   Calcium 9.0 8.9 - 95.6 mg/dL   Total Protein 6.0 (L)  6.5 - 8.1 g/dL   Albumin 3.2 (L) 3.5 - 5.0 g/dL   AST 18 15 - 41 U/L   ALT 40 14 - 54 U/L   Alkaline Phosphatase 107 38 - 126 U/L   Total Bilirubin 0.5 0.3 - 1.2 mg/dL   GFR calc non Af Amer >60 >60 mL/min   GFR calc Af Amer >60 >60 mL/min   Anion gap 10 5 - 15  Protein / creatinine ratio, urine     Status: None   Collection Time: 02/29/16 11:18 PM  Result Value Ref Range   Creatinine, Urine 55.00 mg/dL   Total Protein, Urine <6 mg/dL   Protein Creatinine Ratio        0.00 - 0.15 mg/mg[Cre]    --/--/O POS, O POS (09/20 1020)   MAU Management/MDM: Ordered labs and reviewed results.  Most likely cause of chills is mastitis given infrequent emptying of breasts in last 24 hours.  No elevated WBCs or abdominal pain, lochia wnl.  Urine sent for culture.  Will treat for mastitis with clindamycin 300 mg TID Q 10 days r/t amoxicillin allergy and no hx of cephalosporin use.  Consult Dr Alysia PennaErvin r/t pt elevated BP.  Changed Vasotec to 10 mg BID instead of daily and one time dose of Lasix 10 mg PO given in MAU.  Pt to f/u in office next week as scheduled.  Pt stable at time of discharge.  ASSESSMENT 1. Mastitis, obstetric, delivered with postpartum condition   2. Hypertension, postpartum condition or complication     PLAN Discharge home   Medication List    TAKE these medications   clindamycin 300 MG capsule Commonly known as:  CLEOCIN Take 1 capsule (300 mg total) by mouth 3 (three) times daily.   enalapril 10 MG tablet Commonly known as:  VASOTEC Take 1 tablet (10 mg total) by mouth 2 (two) times  daily. What changed:  when to take this   hydrocortisone-pramoxine rectal foam Commonly known as:  PROCTOFOAM HC Place 1 applicator rectally 2 (two) times daily.   ibuprofen 600 MG tablet Commonly known as:  ADVIL,MOTRIN Take 1 tablet (600 mg total) by mouth every 6 (six) hours.   NIFEdipine 60 MG 24 hr tablet Commonly known as:  PROCARDIA-XL/ADALAT CC Take 1 tablet (60 mg total) by mouth daily.   phenylephrine-shark liver oil-mineral oil-petrolatum 0.25-3-14-71.9 % rectal ointment Commonly known as:  PREPARATION H Place 1 application rectally 2 (two) times daily as needed for hemorrhoids.   prenatal multivitamin Tabs tablet Take 2 tablets by mouth daily at 12 noon.      Follow-up Information    [facility]   Specialty:  Obstetrics and Gynecology Why:  As scheduled on October 6. Return to MAU as needed for emergencies. Contact information: [address] [place] [id number] [phone]          Sharen CounterLisa Leftwich-Kirby Certified Nurse-Midwife 03/01/2016  1:42 AM

## 2016-03-01 NOTE — Discharge Instructions (Signed)
Breastfeeding and Mastitis Mastitis is inflammation of the breast tissue. It can occur in women who are breastfeeding. This can make breastfeeding painful. Mastitis will sometimes go away on its own. Your health care provider will help determine if treatment is needed. CAUSES Mastitis is often associated with a blocked milk (lactiferous) duct. This can happen when too much milk builds up in the breast. Causes of excess milk in the breast can include:  Poor latch-on. If your baby is not latched onto the breast properly, she or he may not empty your breast completely while breastfeeding.  Allowing too much time to pass between feedings.  Wearing a bra or other clothing that is too tight. This puts extra pressure on the lactiferous ducts so milk does not flow through them as it should. Mastitis can also be caused by a bacterial infection. Bacteria may enter the breast tissue through cuts or openings in the skin. In women who are breastfeeding, this may occur because of cracked or irritated skin. Cracks in the skin are often caused when your baby does not latch on properly to the breast. SIGNS AND SYMPTOMS  Swelling, redness, tenderness, and pain in an area of the breast.  Swelling of the glands under the arm on the same side.  Fever may or may not accompany mastitis. If an infection is allowed to progress, a collection of pus (abscess) may develop. DIAGNOSIS  Your health care provider can usually diagnose mastitis based on your symptoms and a physical exam. Tests may be done to help confirm the diagnosis. These may include:  Removal of pus from the breast by applying pressure to the area. This pus can be examined in the lab to determine which bacteria are present. If an abscess has developed, the fluid in the abscess can be removed with a needle. This can also be used to confirm the diagnosis and determine the bacteria present. In most cases, pus will not be present.  Blood tests to determine if  your body is fighting a bacterial infection.  Mammogram or ultrasound tests to rule out other problems or diseases. TREATMENT  Mastitis that occurs with breastfeeding will sometimes go away on its own. Your health care provider may choose to wait 24 hours after first seeing you to decide whether a prescription medicine is needed. If your symptoms are worse after 24 hours, your health care provider will likely prescribe an antibiotic medicine to treat the mastitis. He or she will determine which bacteria are most likely causing the infection and will then select an appropriate antibiotic medicine. This is sometimes changed based on the results of tests performed to identify the bacteria, or if there is no response to the antibiotic medicine selected. Antibiotic medicines are usually given by mouth. You may also be given medicine for pain. HOME CARE INSTRUCTIONS  Only take over-the-counter or prescription medicines for pain, fever, or discomfort as directed by your health care provider.  If your health care provider prescribed an antibiotic medicine, take the medicine as directed. Make sure you finish it even if you start to feel better.  Do not wear a tight or underwire bra. Wear a soft, supportive bra.  Increase your fluid intake, especially if you have a fever.  Continue to empty the breast. It is safe to breastfeed while you have mastitis.  Keep your nipples clean and dry.  Empty the first breast completely before going to the other breast. If your baby is not emptying your breasts completely for some reason,  use a breast pump to empty your breasts.  If you go back to work, pump your breasts while at work to stay in time with your nursing schedule.  Avoid allowing your breasts to become overly filled with milk (engorged). SEEK MEDICAL CARE IF:  You have pus-like discharge from the breast.  Your symptoms do not improve with the treatment prescribed by your health care provider within 2  days. SEEK IMMEDIATE MEDICAL CARE IF:  Your pain and swelling are getting worse.  You have pain that is not controlled with medicine.  You have a red line extending from the breast toward your armpit.  You have a fever or persistent symptoms for more than 2-3 days.  You have a fever and your symptoms suddenly get worse. MAKE SURE YOU:   Understand these instructions.  Will watch your condition.  Will get help right away if you are not doing well or get worse.   This information is not intended to replace advice given to you by your health care provider. Make sure you discuss any questions you have with your health care provider.   Document Released: 09/13/2004 Document Revised: 05/24/2013 Document Reviewed: 12/23/2012 Elsevier Interactive Patient Education 2016 [employer].  Hypertension During Pregnancy Hypertension, or high blood pressure, is when there is extra pressure inside your blood vessels that carry blood from the heart to the rest of your body (arteries). It can happen at any time in life, including pregnancy. Hypertension during pregnancy can cause problems for you and your baby. Your baby might not weigh as much as he or she should at birth or might be born early (premature). Very bad cases of hypertension during pregnancy can be life-threatening.  Different types of hypertension can occur during pregnancy. These include:  Chronic hypertension. This happens when a woman has hypertension before pregnancy and it continues during pregnancy.  Gestational hypertension. This is when hypertension develops during pregnancy.  Preeclampsia or toxemia of pregnancy. This is a very serious type of hypertension that develops only during pregnancy. It affects the whole body and can be very dangerous for both mother and baby.  Gestational hypertension and preeclampsia usually go away after your baby is born. Your blood pressure will likely stabilize within 6 weeks. Women who have  hypertension during pregnancy have a greater chance of developing hypertension later in life or with future pregnancies. RISK FACTORS There are certain factors that make it more likely for you to develop hypertension during pregnancy. These include:  Having hypertension before pregnancy.  Having hypertension during a previous pregnancy.  Being overweight.  Being older than 40 years.  Being pregnant with more than one baby.  Having diabetes or kidney problems. SIGNS AND SYMPTOMS Chronic and gestational hypertension rarely cause symptoms. Preeclampsia has symptoms, which may include:  Increased protein in your urine. Your health care provider will check for this at every prenatal visit.  Swelling of your hands and face.  Rapid weight gain.  Headaches.  Visual changes.  Being bothered by light.  Abdominal pain, especially in the upper right area.  Chest pain.  Shortness of breath.  Increased reflexes.  Seizures. These occur with a more severe form of preeclampsia, called eclampsia. DIAGNOSIS  You may be diagnosed with hypertension during a regular prenatal exam. At each prenatal visit, you may have:  Your blood pressure checked.  A urine test to check for protein in your urine. The type of hypertension you are diagnosed with depends on when you developed it. It  also depends on your specific blood pressure reading.  Developing hypertension before 20 weeks of pregnancy is consistent with chronic hypertension.  Developing hypertension after 20 weeks of pregnancy is consistent with gestational hypertension.  Hypertension with increased urinary protein is diagnosed as preeclampsia.  Blood pressure measurements that stay above 160 systolic or 110 diastolic are a sign of severe preeclampsia. TREATMENT Treatment for hypertension during pregnancy varies. Treatment depends on the type of hypertension and how serious it is.  If you take medicine for chronic hypertension,  you may need to switch medicines.  Medicines called ACE inhibitors should not be taken during pregnancy.  Low-dose aspirin may be suggested for women who have risk factors for preeclampsia.  If you have gestational hypertension, you may need to take a blood pressure medicine that is safe during pregnancy. Your health care provider will recommend the correct medicine.  If you have severe preeclampsia, you may need to be in the hospital. Health care providers will watch you and your baby very closely. You also may need to take medicine called magnesium sulfate to prevent seizures and lower blood pressure.  Sometimes, an early delivery is needed. This may be the case if the condition worsens. It would be done to protect you and your baby. The only cure for preeclampsia is delivery.  Your health care provider may recommend that you take one low-dose aspirin (81 mg) each day to help prevent high blood pressure during your pregnancy if you are at risk for preeclampsia. You may be at risk for preeclampsia if:  You had preeclampsia or eclampsia during a previous pregnancy.  Your baby did not grow as expected during a previous pregnancy.  You experienced preterm birth with a previous pregnancy.  You experienced a separation of the placenta from the uterus (placental abruption) during a previous pregnancy.  You experienced the loss of your baby during a previous pregnancy.  You are pregnant with more than one baby.  You have other medical conditions, such as diabetes or an autoimmune disease. HOME CARE INSTRUCTIONS  Schedule and keep all of your regular prenatal care appointments. This is important.  Take medicines only as directed by your health care provider. Tell your health care provider about all medicines you take.  Eat as little salt as possible.  Get regular exercise.  Do not drink alcohol.  Do not use tobacco products.  Do not drink products with caffeine.  Lie on your left  side when resting. SEEK IMMEDIATE MEDICAL CARE IF:  You have severe abdominal pain.  You have sudden swelling in your hands, ankles, or face.  You gain 4 pounds (1.8 kg) or more in 1 week.  You vomit repeatedly.  You have vaginal bleeding.  You do not feel your baby moving as much.  You have a headache.  You have blurred or double vision.  You have muscle twitching or spasms.  You have shortness of breath.  You have blue fingernails or lips.  You have blood in your urine. MAKE SURE YOU:  Understand these instructions.  Will watch your condition.  Will get help right away if you are not doing well or get worse.   This information is not intended to replace advice given to you by your health care provider. Make sure you discuss any questions you have with your health care provider.   Document Released: 02/04/2011 Document Revised: 06/09/2014 Document Reviewed: 12/16/2012 Elsevier Interactive Patient Education [employer]2016 [employer] Inc[employer]

## 2016-03-02 LAB — URINE CULTURE

## 2016-03-05 ENCOUNTER — Encounter: Payer: Medicaid Other | Admitting: Obstetrics and Gynecology

## 2016-03-07 ENCOUNTER — Ambulatory Visit (INDEPENDENT_AMBULATORY_CARE_PROVIDER_SITE_OTHER): Payer: Medicaid Other

## 2016-03-07 VITALS — BP 132/95 | HR 90

## 2016-03-07 DIAGNOSIS — Z0130 Encounter for examination of blood pressure without abnormal findings: Secondary | ICD-10-CM

## 2016-03-07 NOTE — Progress Notes (Signed)
Pt here today for BP check s/p HTN post delivery.  Pt states that she took her Vasotec 10 mg this am around 0800.  BP 132/95 RA.  Notified Dr. Debroah LoopArnold, no new recommendations.  Pt to continue to taking Vasotec as prescribed, monitor for s/sx HTN, and that she will be evaluated on her pp visit scheduled for 03/25/16.  Pt agreed with no further questions.

## 2016-03-25 ENCOUNTER — Encounter: Payer: Self-pay | Admitting: Obstetrics and Gynecology

## 2016-03-25 ENCOUNTER — Ambulatory Visit (INDEPENDENT_AMBULATORY_CARE_PROVIDER_SITE_OTHER): Payer: Medicaid Other | Admitting: Obstetrics and Gynecology

## 2016-03-25 NOTE — Progress Notes (Signed)
Subjective:     Rachel Ritter is a 25 y.o. female who presents for a postpartum visit. She is six weeks postpartum following a  Vaginal delivery. I have fully reviewed the prenatal and intrapartum course. The delivery was at 38 gestational weeks. IOL for preeclampsia. No hx CHTN.  Outcome: vaginal. Anesthesia: Epidual. Postpartum course has been complicated by BP elevations and she was discharged on Lisinopril 10mg  bid and Procardia 60mg  qd which she is still taking. Baby's course has been uncomplicated. Baby is feeding by breast/bottle. Bleeding scant at this time. Bowel function is normal. Bladder function is normal. Patient is not sexually active. Contraception method is none at this time. Postpartum depression screening: negative  The following portions of the patient's history were reviewed and updated as appropriate: allergies, current medications, past family history, past medical history, past social history, past surgical history and problem list.  Review of Systems Pertinent items are noted in HPI.   Objective:    There were no vitals taken for this visit.  General:  alert, cooperative and no distress   Breasts:  inspection negative, no nipple discharge or bleeding, no masses or nodularity palpable  Lungs: clear to auscultation bilaterally  Heart:  regular rate and rhythm, S1, S2 normal, no murmur, click, rub or gallop  Abdomen: soft, non-tender; bowel sounds normal; no masses,  no organomegaly   Vulva:  not evaluated  Vagina: not evaluated  Cervix:  not evaluated  Corpus: not examined  Adnexa:  not evaluated  Rectal Exam: Not performed.        Assessment:   5 weeks postpartum exam. Pap smear not done at today's visit.  On antihypertensives since post delivery and now normotensive Plan:    1. Contraception: condoms now, discussed options at length. Desires another pregnancy in about 2 years. 2. C/W Dr. Rande LawmanErwin > will D/C Procardia and continue Vasotec with BP recheck in 3  weeks.  3. Follow up in: 3 weeks or as needed.  Pap due in June 2018.

## 2016-03-25 NOTE — Patient Instructions (Signed)
Etonogestrel implant What is this medicine? ETONOGESTREL (et oh noe JES trel) is a contraceptive (birth control) device. It is used to prevent pregnancy. It can be used for up to 3 years. This medicine may be used for other purposes; ask your health care provider or pharmacist if you have questions. What should I tell my health care provider before I take this medicine? They need to know if you have any of these conditions: -abnormal vaginal bleeding -blood vessel disease or blood clots -cancer of the breast, cervix, or liver -depression -diabetes -gallbladder disease -headaches -heart disease or recent heart attack -high blood pressure -high cholesterol -kidney disease -liver disease -renal disease -seizures -tobacco smoker -an unusual or allergic reaction to etonogestrel, other hormones, anesthetics or antiseptics, medicines, foods, dyes, or preservatives -pregnant or trying to get pregnant -breast-feeding How should I use this medicine? This device is inserted just under the skin on the inner side of your upper arm by a health care professional. Talk to your pediatrician regarding the use of this medicine in children. Special care may be needed. Overdosage: If you think you have taken too much of this medicine contact a poison control center or emergency room at once. NOTE: This medicine is only for you. Do not share this medicine with others. What if I miss a dose? This does not apply. What may interact with this medicine? Do not take this medicine with any of the following medications: -amprenavir -bosentan -fosamprenavir This medicine may also interact with the following medications: -barbiturate medicines for inducing sleep or treating seizures -certain medicines for fungal infections like ketoconazole and itraconazole -griseofulvin -medicines to treat seizures like carbamazepine, felbamate, oxcarbazepine, phenytoin,  topiramate -modafinil -phenylbutazone -rifampin -some medicines to treat HIV infection like atazanavir, indinavir, lopinavir, nelfinavir, tipranavir, ritonavir -St. John's wort This list may not describe all possible interactions. Give your health care provider a list of all the medicines, herbs, non-prescription drugs, or dietary supplements you use. Also tell them if you smoke, drink alcohol, or use illegal drugs. Some items may interact with your medicine. What should I watch for while using this medicine? This product does not protect you against HIV infection (AIDS) or other sexually transmitted diseases. You should be able to feel the implant by pressing your fingertips over the skin where it was inserted. Contact your doctor if you cannot feel the implant, and use a non-hormonal birth control method (such as condoms) until your doctor confirms that the implant is in place. If you feel that the implant may have broken or become bent while in your arm, contact your healthcare provider. What side effects may I notice from receiving this medicine? Side effects that you should report to your doctor or health care professional as soon as possible: -allergic reactions like skin rash, itching or hives, swelling of the face, lips, or tongue -breast lumps -changes in emotions or moods -depressed mood -heavy or prolonged menstrual bleeding -pain, irritation, swelling, or bruising at the insertion site -scar at site of insertion -signs of infection at the insertion site such as fever, and skin redness, pain or discharge -signs of pregnancy -signs and symptoms of a blood clot such as breathing problems; changes in vision; chest pain; severe, sudden headache; pain, swelling, warmth in the leg; trouble speaking; sudden numbness or weakness of the face, arm or leg -signs and symptoms of liver injury like dark yellow or brown urine; general ill feeling or flu-like symptoms; light-colored stools; loss of  appetite; nausea; right upper belly   pain; unusually weak or tired; yellowing of the eyes or skin -unusual vaginal bleeding, discharge -signs and symptoms of a stroke like changes in vision; confusion; trouble speaking or understanding; severe headaches; sudden numbness or weakness of the face, arm or leg; trouble walking; dizziness; loss of balance or coordination Side effects that usually do not require medical attention (Report these to your doctor or health care professional if they continue or are bothersome.): -acne -back pain -breast pain -changes in weight -dizziness -general ill feeling or flu-like symptoms -headache -irregular menstrual bleeding -nausea -sore throat -vaginal irritation or inflammation This list may not describe all possible side effects. Call your doctor for medical advice about side effects. You may report side effects to FDA at 1-800-FDA-1088. Where should I keep my medicine? This drug is given in a hospital or clinic and will not be stored at home. NOTE: This sheet is a summary. It may not cover all possible information. If you have questions about this medicine, talk to your doctor, pharmacist, or health care provider.    2016, Elsevier/Gold Standard. (2014-03-03 14:07:06) Contraception Choices Contraception (birth control) is the use of any methods or devices to prevent pregnancy. Below are some methods to help avoid pregnancy. HORMONAL METHODS   Contraceptive implant. This is a thin, plastic tube containing progesterone hormone. It does not contain estrogen hormone. Your health care provider inserts the tube in the inner part of the upper arm. The tube can remain in place for up to 3 years. After 3 years, the implant must be removed. The implant prevents the ovaries from releasing an egg (ovulation), thickens the cervical mucus to prevent sperm from entering the uterus, and thins the lining of the inside of the uterus.  Progesterone-only injections. These  injections are given every 3 months by your health care provider to prevent pregnancy. This synthetic progesterone hormone stops the ovaries from releasing eggs. It also thickens cervical mucus and changes the uterine lining. This makes it harder for sperm to survive in the uterus.  Birth control pills. These pills contain estrogen and progesterone hormone. They work by preventing the ovaries from releasing eggs (ovulation). They also cause the cervical mucus to thicken, preventing the sperm from entering the uterus. Birth control pills are prescribed by a health care provider.Birth control pills can also be used to treat heavy periods.  Minipill. This type of birth control pill contains only the progesterone hormone. They are taken every day of each month and must be prescribed by your health care provider.  Birth control patch. The patch contains hormones similar to those in birth control pills. It must be changed once a week and is prescribed by a health care provider.  Vaginal ring. The ring contains hormones similar to those in birth control pills. It is left in the vagina for 3 weeks, removed for 1 week, and then a new one is put back in place. The patient must be comfortable inserting and removing the ring from the vagina.A health care provider's prescription is necessary.  Emergency contraception. Emergency contraceptives prevent pregnancy after unprotected sexual intercourse. This pill can be taken right after sex or up to 5 days after unprotected sex. It is most effective the sooner you take the pills after having sexual intercourse. Most emergency contraceptive pills are available without a prescription. Check with your pharmacist. Do not use emergency contraception as your only form of birth control. BARRIER METHODS   Female condom. This is a thin sheath (latex or rubber) that is  worn over the penis during sexual intercourse. It can be used with spermicide to increase  effectiveness.  Female condom. This is a soft, loose-fitting sheath that is put into the vagina before sexual intercourse.  Diaphragm. This is a soft, latex, dome-shaped barrier that must be fitted by a health care provider. It is inserted into the vagina, along with a spermicidal jelly. It is inserted before intercourse. The diaphragm should be left in the vagina for 6 to 8 hours after intercourse.  Cervical cap. This is a round, soft, latex or plastic cup that fits over the cervix and must be fitted by a health care provider. The cap can be left in place for up to 48 hours after intercourse.  Sponge. This is a soft, circular piece of polyurethane foam. The sponge has spermicide in it. It is inserted into the vagina after wetting it and before sexual intercourse.  Spermicides. These are chemicals that kill or block sperm from entering the cervix and uterus. They come in the form of creams, jellies, suppositories, foam, or tablets. They do not require a prescription. They are inserted into the vagina with an applicator before having sexual intercourse. The process must be repeated every time you have sexual intercourse. INTRAUTERINE CONTRACEPTION  Intrauterine device (IUD). This is a T-shaped device that is put in a woman's uterus during a menstrual period to prevent pregnancy. There are 2 types:  Copper IUD. This type of IUD is wrapped in copper wire and is placed inside the uterus. Copper makes the uterus and fallopian tubes produce a fluid that kills sperm. It can stay in place for 10 years.  Hormone IUD. This type of IUD contains the hormone progestin (synthetic progesterone). The hormone thickens the cervical mucus and prevents sperm from entering the uterus, and it also thins the uterine lining to prevent implantation of a fertilized egg. The hormone can weaken or kill the sperm that get into the uterus. It can stay in place for 3-5 years, depending on which type of IUD is used. PERMANENT  METHODS OF CONTRACEPTION  Female tubal ligation. This is when the woman's fallopian tubes are surgically sealed, tied, or blocked to prevent the egg from traveling to the uterus.  Hysteroscopic sterilization. This involves placing a small coil or insert into each fallopian tube. Your doctor uses a technique called hysteroscopy to do the procedure. The device causes scar tissue to form. This results in permanent blockage of the fallopian tubes, so the sperm cannot fertilize the egg. It takes about 3 months after the procedure for the tubes to become blocked. You must use another form of birth control for these 3 months.  Female sterilization. This is when the female has the tubes that carry sperm tied off (vasectomy).This blocks sperm from entering the vagina during sexual intercourse. After the procedure, the man can still ejaculate fluid (semen). NATURAL PLANNING METHODS  Natural family planning. This is not having sexual intercourse or using a barrier method (condom, diaphragm, cervical cap) on days the woman could become pregnant.  Calendar method. This is keeping track of the length of each menstrual cycle and identifying when you are fertile.  Ovulation method. This is avoiding sexual intercourse during ovulation.  Symptothermal method. This is avoiding sexual intercourse during ovulation, using a thermometer and ovulation symptoms.  Post-ovulation method. This is timing sexual intercourse after you have ovulated. Regardless of which type or method of contraception you choose, it is important that you use condoms to protect against the transmission  of sexually transmitted infections (STIs). Talk with your health care provider about which form of contraception is most appropriate for you.   This information is not intended to replace advice given to you by your health care provider. Make sure you discuss any questions you have with your health care provider.   Document Released: 05/19/2005  Document Revised: 05/24/2013 Document Reviewed: 11/11/2012 Elsevier Interactive Patient Education [employer]

## 2016-04-15 ENCOUNTER — Ambulatory Visit: Payer: Medicaid Other

## 2016-04-15 VITALS — BP 139/90 | HR 89

## 2016-04-15 DIAGNOSIS — Z0130 Encounter for examination of blood pressure without abnormal findings: Secondary | ICD-10-CM

## 2016-04-15 NOTE — Progress Notes (Signed)
Patient presented to clinic today for blood pressure check. Patient stated she is doing well at this time. Her blood pressure today is 139/90 P80.  Patient is currently taking Vasotec 10mg . Per Dr.Arnold she should continued taking Vasotec 10mg . [facility] regarding her blood pressure. Patient voice understanding at this time.

## 2016-05-11 ENCOUNTER — Encounter (HOSPITAL_COMMUNITY): Payer: Self-pay | Admitting: Oncology

## 2016-05-11 ENCOUNTER — Emergency Department (HOSPITAL_COMMUNITY)
Admission: EM | Admit: 2016-05-11 | Discharge: 2016-05-11 | Disposition: A | Discharge status: Home / Self Care | Payer: Self-pay | Attending: Emergency Medicine | Admitting: Emergency Medicine

## 2016-05-11 ENCOUNTER — Emergency Department (HOSPITAL_COMMUNITY): Payer: Self-pay

## 2016-05-11 DIAGNOSIS — Z79899 Other long term (current) drug therapy: Secondary | ICD-10-CM | POA: Insufficient documentation

## 2016-05-11 DIAGNOSIS — N83519 Torsion of ovary and ovarian pedicle, unspecified side: Secondary | ICD-10-CM

## 2016-05-11 DIAGNOSIS — B9689 Other specified bacterial agents as the cause of diseases classified elsewhere: Secondary | ICD-10-CM

## 2016-05-11 DIAGNOSIS — R102 Pelvic and perineal pain: Secondary | ICD-10-CM

## 2016-05-11 DIAGNOSIS — N760 Acute vaginitis: Secondary | ICD-10-CM | POA: Insufficient documentation

## 2016-05-11 LAB — COMPREHENSIVE METABOLIC PANEL
ALK PHOS: 88 U/L (ref 38–126)
ALT: 23 U/L (ref 14–54)
ANION GAP: 8 (ref 5–15)
AST: 43 U/L — ABNORMAL HIGH (ref 15–41)
Albumin: 4 g/dL (ref 3.5–5.0)
BILIRUBIN TOTAL: 0.9 mg/dL (ref 0.3–1.2)
BUN: 16 mg/dL (ref 6–20)
CALCIUM: 8.9 mg/dL (ref 8.9–10.3)
CO2: 26 mmol/L (ref 22–32)
Chloride: 107 mmol/L (ref 101–111)
Creatinine, Ser: 0.83 mg/dL (ref 0.44–1.00)
GFR calc non Af Amer: >60 mL/min (ref >60)
GLUCOSE: 126 mg/dL — AB (ref 65–99)
Potassium: 3.8 mmol/L (ref 3.5–5.1)
Sodium: 141 mmol/L (ref 135–145)
TOTAL PROTEIN: 6.9 g/dL (ref 6.5–8.1)

## 2016-05-11 LAB — URINALYSIS, ROUTINE W REFLEX MICROSCOPIC
BILIRUBIN URINE: NEGATIVE
Glucose, UA: NEGATIVE mg/dL
Hgb urine dipstick: NEGATIVE
Ketones, ur: NEGATIVE mg/dL
Leukocytes, UA: NEGATIVE
NITRITE: NEGATIVE
PROTEIN: NEGATIVE mg/dL
SPECIFIC GRAVITY, URINE: 1.023 (ref 1.005–1.030)
pH: 6 (ref 5.0–8.0)

## 2016-05-11 LAB — LIPASE, BLOOD: Lipase: 32 U/L (ref 11–51)

## 2016-05-11 LAB — WET PREP, GENITAL
SPERM: NONE SEEN
Trich, Wet Prep: NONE SEEN
YEAST WET PREP: NONE SEEN

## 2016-05-11 LAB — CBC
HCT: 39.9 % (ref 36.0–46.0)
HEMOGLOBIN: 13.2 g/dL (ref 12.0–15.0)
MCH: 27 pg (ref 26.0–34.0)
MCHC: 33.1 g/dL (ref 30.0–36.0)
MCV: 81.8 fL (ref 78.0–100.0)
Platelets: 299 10*3/uL (ref 150–400)
RBC: 4.88 MIL/uL (ref 3.87–5.11)
RDW: 19.4 % — ABNORMAL HIGH (ref 11.5–15.5)
WBC: 8 10*3/uL (ref 4.0–10.5)

## 2016-05-11 LAB — RAPID HIV SCREEN (HIV 1/2 AB+AG)
HIV 1/2 Antibodies: NONREACTIVE
HIV-1 P24 ANTIGEN - HIV24: NONREACTIVE

## 2016-05-11 LAB — POC URINE PREG, ED: PREG TEST UR: NEGATIVE

## 2016-05-11 MED ORDER — MORPHINE SULFATE (PF) 4 MG/ML IV SOLN
6.0000 mg | Freq: Once | INTRAVENOUS | Status: AC
  Administered 2016-05-11: 6 mg via INTRAVENOUS
  Filled 2016-05-11: qty 2

## 2016-05-11 MED ORDER — METRONIDAZOLE 500 MG PO TABS: 500.0000 mg | ORAL_TABLET | Freq: Two times a day (BID) | ORAL | 0 refills | Status: DC

## 2016-05-11 MED ORDER — IBUPROFEN 800 MG PO TABS: 800.0000 mg | ORAL_TABLET | Freq: Three times a day (TID) | ORAL | 0 refills | Status: DC

## 2016-05-11 MED ORDER — ONDANSETRON HCL 4 MG/2ML IJ SOLN
4.0000 mg | Freq: Once | INTRAMUSCULAR | Status: AC
  Administered 2016-05-11: 4 mg via INTRAVENOUS
  Filled 2016-05-11: qty 2

## 2016-05-11 NOTE — Discharge Instructions (Signed)
You have been evaluated for your abdominal pain.  No concerning finding were found during this visit.  Please take ibuprofen as needed for pain.  Take flagyl as treatment for BV.  Return if your condition worsen .

## 2016-05-11 NOTE — ED Triage Notes (Signed)
Pt presents d/t generalized abdominal pain and back pain x 30 minutes.  +nausea.  Pt rates pain 10/10, twisting in nature.  Normal BM this am.

## 2016-05-11 NOTE — ED Provider Notes (Signed)
[facility]-EMERGENCY DEPT Provider Note   CSN: [id number] Arrival date & time: 05/11/16  0600     History   Chief Complaint Chief Complaint  Patient presents with  . Abdominal Pain    HPI MAKELA NIEHOFF is a 25 y.o. female.  HPI   25 year old female presenting for evaluation of abdominal pain. Patient reports she was awoke with pain to her left low back. Pain is a sharp achy twisting sensation. She tries to lay on her abdomen but then noticed pain to her abdomen as well. Pain is intense, rate as 10 out of 10, now diffuse, with mild nausea. Pain started approximately an hour ago. She has never had this kind of pain before. Aside from mild vaginal discharge patient denies fever, chills, lightheadedness, dizziness, chest pain, shortness of breath, productive cough, dysuria, hematuria, vaginal bleeding, or rash. She gave birth 2 months ago and has just returned back to work recently. Did report some heavy lifting but denies any pain. She is sexually active, last sexual activities was 2 days ago. She has not had her menstrual period since giving birth.  Past Medical History:  Diagnosis Date  . Medical history non-contributory   . Pregnancy induced hypertension     Patient Active Problem List   Diagnosis Date Noted  . Pre-eclampsia in postpartum period 02/26/2016  . Status post vaginal delivery 02/23/2016    Past Surgical History:  Procedure Laterality Date  . NO PAST SURGERIES      OB History    Gravida Para Term Preterm AB Living   1 1 1     1    SAB TAB Ectopic Multiple Live Births         0 1       Home Medications    Prior to Admission medications   Medication Sig Start Date End Date Taking? Authorizing Provider  clindamycin (CLEOCIN) 300 MG capsule Take 1 capsule (300 mg total) by mouth 3 (three) times daily. 03/01/16   Lisa A Leftwich-Kirby, CNM  enalapril (VASOTEC) 10 MG tablet Take 1 tablet (10 mg total) by mouth 2 (two) times daily. 03/01/16   Lisa A Leftwich-Kirby,  CNM  hydrocortisone-pramoxine (PROCTOFOAM HC) rectal foam Place 1 applicator rectally 2 (two) times daily. 02/13/16   Marlis Edelson, CNM  ibuprofen (ADVIL,MOTRIN) 600 MG tablet Take 1 tablet (600 mg total) by mouth every 6 (six) hours. 02/23/16   Kandra Nicolas Degele, MD  NIFEdipine (PROCARDIA-XL/ADALAT CC) 60 MG 24 hr tablet Take 1 tablet (60 mg total) by mouth daily. 02/29/16   Tereso Newcomer, MD  phenylephrine-shark liver oil-mineral oil-petrolatum (PREPARATION H) 0.25-3-14-71.9 % rectal ointment Place 1 application rectally 2 (two) times daily as needed for hemorrhoids.    Historical Provider, MD  Prenatal Vit-Fe Fumarate-FA (PRENATAL MULTIVITAMIN) TABS tablet Take 2 tablets by mouth daily at 12 noon.     Historical Provider, MD    Family History Family History  Problem Relation Age of Onset  . Hypertension Mother   . Hypertension Sister   . Hypertension Maternal Aunt   . Hypertension Maternal Grandfather   . Diabetes Paternal Grandmother   . Stroke Paternal Grandmother     Social History Social History  Substance Use Topics  . Smoking status: Never Smoker  . Smokeless tobacco: Never Used  . Alcohol use No     Allergies   Amoxicillin   Review of Systems Review of Systems  All other systems reviewed and are negative.    Physical Exam Updated  Vital Signs BP 150/100 (BP Location: Right Arm)   Pulse 115   Temp 98.4 F (36.9 C) (Oral)   Resp 20   Ht 5\' 4"  (1.626 m)   Wt 97.1 kg   SpO2 100%   BMI 36.73 kg/m   Physical Exam  Constitutional: She appears well-developed and well-nourished. No distress.  HENT:  Head: Atraumatic.  Eyes: Conjunctivae are normal.  Neck: Neck supple.  Cardiovascular: Normal rate and regular rhythm.   Pulmonary/Chest: Effort normal and breath sounds normal. She has no wheezes.  Abdominal: Soft. There is tenderness (Tenderness to left lower quadrant on palpation without guarding or rebound tenderness).  No CVA tenderness  Genitourinary:    Genitourinary Comments: Pelvic exam: RN in room as chaperone, external female genitalia normal with no signs of lesions or injuries. Speculum exam shows normal cervix with no obvious discharge. Bimanual exam with no adnexal tenderness, no cervical motion tenderness, uterus normal size and nontender, no masses appreciated. The external cervical os is closed.   Neurological: She is alert.  Skin: No rash noted.  Psychiatric: She has a normal mood and affect.  Nursing note and vitals reviewed.    ED Treatments / Results  Labs (all labs ordered are listed, but only abnormal results are displayed) Labs Reviewed  WET PREP, GENITAL - Abnormal; Notable for the following:       Result Value   Clue Cells Wet Prep HPF POC PRESENT (*)    WBC, Wet Prep HPF POC MANY (*)    All other components within normal limits  COMPREHENSIVE METABOLIC PANEL - Abnormal; Notable for the following:    Glucose, Bld 126 (*)    AST 43 (*)    All other components within normal limits  CBC - Abnormal; Notable for the following:    RDW 19.4 (*)    All other components within normal limits  LIPASE, BLOOD  URINALYSIS, ROUTINE W REFLEX MICROSCOPIC  RAPID HIV SCREEN (HIV 1/2 AB+AG)  POC URINE PREG, ED  GC/CHLAMYDIA PROBE AMP ([place]al[place]) NOT AT [facility]    EKG  EKG Interpretation None       Radiology [place] Transvaginal Non-ob  Result Date: 05/11/2016 CLINICAL DATA:  Acute pelvic and back pain.  LMP 02/22/2016. EXAM: TRANSABDOMINAL AND TRANSVAGINAL ULTRASOUND OF PELVIS DOPPLER ULTRASOUND OF OVARIES TECHNIQUE: Both transabdominal and transvaginal ultrasound examinations of the pelvis were performed. Transabdominal technique was performed for global imaging of the pelvis including uterus, ovaries, adnexal regions, and pelvic cul-de-sac. It was necessary to proceed with endovaginal exam following the transabdominal exam to visualize the endometrium and adnexa. Color and duplex Doppler ultrasound was utilized to evaluate  blood flow to the ovaries. COMPARISON:  No prior non obstetric pelvic sonogram. FINDINGS: Uterus Measurements: 9.3 x 4.8 x 5.8 cm. Normal anteverted uterus, with no uterine fibroids or other myometrial abnormalities. Endometrium Thickness: 11 mm. No endometrial cavity fluid or focal endometrial mass demonstrated. Right ovary Measurements: 3.7 x 2.6 x 3.1 cm (volume = 16 cm^3). Normal appearance/no adnexal mass. Left ovary Measurements: 3.6 x 1.8 x 2.4 cm (volume = 8 cm^3). Normal appearance/no adnexal mass. Pulsed Doppler evaluation of both ovaries demonstrates normal low-resistance arterial and venous waveforms. Other findings No abnormal free fluid. IMPRESSION: Normal pelvic sonogram.  No evidence of adnexal torsion. Electronically Signed   By: Delbert PhenixJason A Poff M.D.   On: 05/11/2016 10:09   [place] Pelvis Complete  Result Date: 05/11/2016 CLINICAL DATA:  Acute pelvic and back pain.  LMP 02/22/2016. EXAM: TRANSABDOMINAL AND  TRANSVAGINAL ULTRASOUND OF PELVIS DOPPLER ULTRASOUND OF OVARIES TECHNIQUE: Both transabdominal and transvaginal ultrasound examinations of the pelvis were performed. Transabdominal technique was performed for global imaging of the pelvis including uterus, ovaries, adnexal regions, and pelvic cul-de-sac. It was necessary to proceed with endovaginal exam following the transabdominal exam to visualize the endometrium and adnexa. Color and duplex Doppler ultrasound was utilized to evaluate blood flow to the ovaries. COMPARISON:  No prior non obstetric pelvic sonogram. FINDINGS: Uterus Measurements: 9.3 x 4.8 x 5.8 cm. Normal anteverted uterus, with no uterine fibroids or other myometrial abnormalities. Endometrium Thickness: 11 mm. No endometrial cavity fluid or focal endometrial mass demonstrated. Right ovary Measurements: 3.7 x 2.6 x 3.1 cm (volume = 16 cm^3). Normal appearance/no adnexal mass. Left ovary Measurements: 3.6 x 1.8 x 2.4 cm (volume = 8 cm^3). Normal appearance/no adnexal mass. Pulsed  Doppler evaluation of both ovaries demonstrates normal low-resistance arterial and venous waveforms. Other findings No abnormal free fluid. IMPRESSION: Normal pelvic sonogram.  No evidence of adnexal torsion. Electronically Signed   By: Delbert Phenix M.D.   On: 05/11/2016 10:09   [place] Art/ven Flow Abd Pelv Doppler  Result Date: 05/11/2016 CLINICAL DATA:  Acute pelvic and back pain.  LMP 02/22/2016. EXAM: TRANSABDOMINAL AND TRANSVAGINAL ULTRASOUND OF PELVIS DOPPLER ULTRASOUND OF OVARIES TECHNIQUE: Both transabdominal and transvaginal ultrasound examinations of the pelvis were performed. Transabdominal technique was performed for global imaging of the pelvis including uterus, ovaries, adnexal regions, and pelvic cul-de-sac. It was necessary to proceed with endovaginal exam following the transabdominal exam to visualize the endometrium and adnexa. Color and duplex Doppler ultrasound was utilized to evaluate blood flow to the ovaries. COMPARISON:  No prior non obstetric pelvic sonogram. FINDINGS: Uterus Measurements: 9.3 x 4.8 x 5.8 cm. Normal anteverted uterus, with no uterine fibroids or other myometrial abnormalities. Endometrium Thickness: 11 mm. No endometrial cavity fluid or focal endometrial mass demonstrated. Right ovary Measurements: 3.7 x 2.6 x 3.1 cm (volume = 16 cm^3). Normal appearance/no adnexal mass. Left ovary Measurements: 3.6 x 1.8 x 2.4 cm (volume = 8 cm^3). Normal appearance/no adnexal mass. Pulsed Doppler evaluation of both ovaries demonstrates normal low-resistance arterial and venous waveforms. Other findings No abnormal free fluid. IMPRESSION: Normal pelvic sonogram.  No evidence of adnexal torsion. Electronically Signed   By: Delbert Phenix M.D.   On: 05/11/2016 10:09    Procedures Procedures (including critical care time)  Medications Ordered in ED Medications  morphine 4 MG/ML injection 6 mg (6 mg Intravenous Given 05/11/16 0724)  ondansetron (ZOFRAN) injection 4 mg (4 mg Intravenous  Given 05/11/16 0724)     Initial Impression / Assessment and Plan / ED Course  I have reviewed the triage vital signs and the nursing notes.  Pertinent labs & imaging results that were available during my care of the patient were reviewed by me and considered in my medical decision making (see chart for details).  Clinical Course     BP 148/100   Pulse 97   Temp 98.4 F (36.9 C) (Oral)   Resp 16   Ht 5\' 4"  (1.626 m)   Wt 97.1 kg   SpO2 100%   BMI 36.73 kg/m    Final Clinical Impressions(s) / ED Diagnoses   Final diagnoses:    Pelvic pain in female  BV (bacterial vaginosis)    New Prescriptions New Prescriptions   IBUPROFEN (ADVIL,MOTRIN) 800 MG TABLET    Take 1 tablet (800 mg total) by mouth 3 (three) times daily.  METRONIDAZOLE (FLAGYL) 500 MG TABLET    Take 1 tablet (500 mg total) by mouth 2 (two) times daily.   6:36 AM Patient presents with an acute onset of left flank and left abdominal pain started early this morning. She has no CVA tenderness on exam. Workup initiated.  8:17 AM Although pt does not have any significant pain on pelvic exam, her exam is limited due to large body habitus.  Does have presents of clue cells and many WBC in her wet prep.  Labs are otherwise reassuring.  UA without sign of infection and pregnancy test is negative.  Will obtain pelvic US to r/o ovarian torsion or TOA.    10:55 AM Transvaginal ultrasound without acute finding. No evidence of TOA of ovarian torsion. Patient's pain is well controlled. Labs are reassuring. Evidence of BV which will be treated with Flagyl. She does have many WBC however without any symptoms, will await for cultures before treating. Low suspicion of appendicitis or any other acute abdominal pathology.   Fayrene HelperBowie Kaylan Friedmann, PA-C 05/11/16 1101    Fayrene HelperBowie Sheyna Pettibone, PA-C 05/15/16 1003    Zadie Rhineonald Wickline, MD 05/17/16 [phone]

## 2016-05-14 LAB — GC/CHLAMYDIA PROBE AMP (~~LOC~~) NOT AT ARMC
Chlamydia: NEGATIVE
Neisseria Gonorrhea: NEGATIVE

## 2016-06-15 ENCOUNTER — Emergency Department (HOSPITAL_COMMUNITY): Payer: BLUE CROSS/BLUE SHIELD

## 2016-06-15 ENCOUNTER — Emergency Department (HOSPITAL_COMMUNITY)
Admission: EM | Admit: 2016-06-15 | Discharge: 2016-06-15 | Disposition: A | Discharge status: Home / Self Care | Payer: BLUE CROSS/BLUE SHIELD | Attending: Emergency Medicine | Admitting: Emergency Medicine

## 2016-06-15 ENCOUNTER — Encounter (HOSPITAL_COMMUNITY): Payer: Self-pay | Admitting: *Deleted

## 2016-06-15 DIAGNOSIS — R05 Cough: Secondary | ICD-10-CM | POA: Diagnosis present

## 2016-06-15 DIAGNOSIS — J111 Influenza due to unidentified influenza virus with other respiratory manifestations: Secondary | ICD-10-CM | POA: Insufficient documentation

## 2016-06-15 DIAGNOSIS — Z79899 Other long term (current) drug therapy: Secondary | ICD-10-CM | POA: Diagnosis not present

## 2016-06-15 DIAGNOSIS — R69 Illness, unspecified: Secondary | ICD-10-CM

## 2016-06-15 MED ORDER — GUAIFENESIN ER 1200 MG PO TB12: 1.0000 | ORAL_TABLET | Freq: Two times a day (BID) | ORAL | 0 refills | Status: DC

## 2016-06-15 MED ORDER — SODIUM CHLORIDE 0.9 % IV BOLUS (SEPSIS)
1000.0000 mL | Freq: Once | INTRAVENOUS | Status: AC
  Administered 2016-06-15: 1000 mL via INTRAVENOUS

## 2016-06-15 MED ORDER — PROMETHAZINE-DM 6.25-15 MG/5ML PO SYRP: 5.0000 mL | ORAL_SOLUTION | Freq: Four times a day (QID) | ORAL | 0 refills | Status: DC | PRN

## 2016-06-15 NOTE — Discharge Instructions (Signed)
Return here as needed.  Follow-up with your primary doctor. °

## 2016-06-15 NOTE — ED Triage Notes (Signed)
Pt reports 5 days of body aches, headache, sore throat and cough. No meds prior to arrival.

## 2016-06-16 NOTE — ED Provider Notes (Signed)
[facility]-EMERGENCY DEPT Provider Note   CSN: [id number] Arrival date & time: 06/15/16  0440     History   Chief Complaint Chief Complaint  Patient presents with  . Generalized Body Aches    HPI Rachel Ritter is a 26 y.o. female.  HPI Patient presents to the emergency department with A history of headache, sore throat, cough and body aches.  Patient states that she is taking over-the-counter medications without significant relief of her symptoms.  She states it better or worse.  The patient denies chest pain, shortness of breath, blurred vision, neck pain, weakness, numbness, dizziness, anorexia, edema, abdominal pain, nausea, vomiting, diarrhea, rash, back pain, dysuria, hematemesis, bloody stool, near syncope, or syncope. Past Medical History:  Diagnosis Date  . Medical history non-contributory   . Pregnancy induced hypertension     Patient Active Problem List   Diagnosis Date Noted  . Pre-eclampsia in postpartum period 02/26/2016  . Status post vaginal delivery 02/23/2016    Past Surgical History:  Procedure Laterality Date  . NO PAST SURGERIES      OB History    Gravida Para Term Preterm AB Living   1 1 1     1    SAB TAB Ectopic Multiple Live Births         0 1       Home Medications    Prior to Admission medications   Medication Sig Start Date End Date Taking? Authorizing Provider  enalapril (VASOTEC) 10 MG tablet Take 1 tablet (10 mg total) by mouth 2 (two) times daily. 03/01/16  Yes Lisa A Leftwich-Kirby, CNM  [place] 500 MG CAPS Take 1,000 mg by mouth daily.   Yes Historical Provider, MD  ibuprofen (ADVIL,MOTRIN) 200 MG tablet Take 600 mg by mouth every 6 (six) hours as needed for moderate pain.   Yes Historical Provider, MD  Prenatal Vit-Fe Fumarate-FA (PRENATAL MULTIVITAMIN) TABS tablet Take 1 tablet by mouth daily at 12 noon.   Yes Historical Provider, MD  clindamycin (CLEOCIN) 300 MG capsule Take 1 capsule (300 mg total) by mouth 3 (three) times  daily. Patient not taking: Reported on 05/11/2016 03/01/16   Wilmer Floor Leftwich-Kirby, CNM  Guaifenesin 1200 MG TB12 Take 1 tablet (1,200 mg total) by mouth 2 (two) times daily. 06/15/16   Charlestine Night, PA-C  hydrocortisone-pramoxine (PROCTOFOAM HC) rectal foam Place 1 applicator rectally 2 (two) times daily. Patient not taking: Reported on 05/11/2016 02/13/16   Marlis Edelson, CNM  ibuprofen (ADVIL,MOTRIN) 800 MG tablet Take 1 tablet (800 mg total) by mouth 3 (three) times daily. Patient not taking: Reported on 06/15/2016 05/11/16   Fayrene Helper, PA-C  metroNIDAZOLE (FLAGYL) 500 MG tablet Take 1 tablet (500 mg total) by mouth 2 (two) times daily. Patient not taking: Reported on 06/15/2016 05/11/16   Fayrene Helper, PA-C  NIFEdipine (PROCARDIA-XL/ADALAT CC) 60 MG 24 hr tablet Take 1 tablet (60 mg total) by mouth daily. Patient not taking: Reported on 05/11/2016 02/29/16   Tereso Newcomer, MD  promethazine-dextromethorphan (PROMETHAZINE-DM) 6.25-15 MG/5ML syrup Take 5 mLs by mouth 4 (four) times daily as needed for cough. 06/15/16   Charlestine Night, PA-C    Family History Family History  Problem Relation Age of Onset  . Hypertension Mother   . Hypertension Sister   . Hypertension Maternal Aunt   . Hypertension Maternal Grandfather   . Diabetes Paternal Grandmother   . Stroke Paternal Grandmother     Social History Social History  Substance Use Topics  .  Smoking status: Never Smoker  . Smokeless tobacco: Never Used  . Alcohol use No     Allergies   Amoxicillin   Review of Systems Review of Systems All other systems negative except as documented in the HPI. All pertinent positives and negatives as reviewed in the HPI.  Physical Exam Updated Vital Signs BP (!) 138/107 (BP Location: Right Arm)   Pulse 90   Temp 98.2 F (36.8 C) (Oral)   Resp 18   Ht 5\' 5"  (1.651 m)   Wt 95.3 kg   SpO2 100%   BMI 34.95 kg/m   Physical Exam  Constitutional: She is oriented to person,  place, and time. She appears well-developed and well-nourished. No distress.  HENT:  Head: Normocephalic and atraumatic.  Mouth/Throat: Oropharynx is clear and moist.  Eyes: Pupils are equal, round, and reactive to light.  Neck: Normal range of motion. Neck supple.  Cardiovascular: Normal rate, regular rhythm and normal heart sounds.  Exam reveals no gallop and no friction rub.   No murmur heard. Pulmonary/Chest: Effort normal and breath sounds normal. No respiratory distress. She has no wheezes.  Abdominal: Soft. Bowel sounds are normal. She exhibits no distension. There is no tenderness.  Neurological: She is alert and oriented to person, place, and time. She exhibits normal muscle tone. Coordination normal.  Skin: Skin is warm and dry. Capillary refill takes less than 2 seconds. No rash noted. No erythema.  Psychiatric: She has a normal mood and affect. Her behavior is normal.  Nursing note and vitals reviewed.    ED Treatments / Results  Labs (all labs ordered are listed, but only abnormal results are displayed) Labs Reviewed - No data to display  EKG  EKG Interpretation None       Radiology Dg Chest 2 View  Result Date: 06/15/2016 CLINICAL DATA:  Cough, myalgias, pharyngitis for the past 5 days. EXAM: CHEST  2 VIEW COMPARISON:  02/25/2016. FINDINGS: Normal sized heart. Clear lungs with normal vascularity. Normal appearing bones. IMPRESSION: Normal examination. Electronically Signed   By: Beckie SaltsSteven  Reid M.D.   On: 06/15/2016 09:03    Procedures Procedures (including critical care time)  Medications Ordered in ED Medications  sodium chloride 0.9 % bolus 1,000 mL (0 mLs Intravenous Stopped 06/15/16 0943)     Initial Impression / Assessment and Plan / ED Course  I have reviewed the triage vital signs and the nursing notes.  Pertinent labs & imaging results that were available during my care of the patient were reviewed by me and considered in my medical decision making  (see chart for details).  Clinical Course     Patient be treated for influenza-like illness.  Told to return here as needed.  Patient agrees the plan and all questions were answered.  Did advise her to increase her fluid intake.  She was given IV fluids in the emergency department.  Patient has been stable  Final Clinical Impressions(s) / ED Diagnoses   Final diagnoses:  Influenza-like illness    New Prescriptions Discharge Medication List as of 06/15/2016  9:38 AM    START taking these medications   Details  Guaifenesin 1200 MG TB12 Take 1 tablet (1,200 mg total) by mouth 2 (two) times daily., Starting Sun 06/15/2016, Print    promethazine-dextromethorphan (PROMETHAZINE-DM) 6.25-15 MG/5ML syrup Take 5 mLs by mouth 4 (four) times daily as needed for cough., Starting Sun 06/15/2016, Print         Charlestine Nighthristopher Shadee Rathod, PA-C 06/16/16 [phone]  Rolland Porter, MD 06/24/16 2047

## 2016-08-31 ENCOUNTER — Other Ambulatory Visit (HOSPITAL_COMMUNITY): Payer: Self-pay | Admitting: Obstetrics & Gynecology

## 2016-09-10 ENCOUNTER — Encounter (HOSPITAL_COMMUNITY): Payer: Self-pay | Admitting: *Deleted

## 2016-09-10 ENCOUNTER — Emergency Department (HOSPITAL_COMMUNITY)
Admission: EM | Admit: 2016-09-10 | Discharge: 2016-09-10 | Disposition: A | Discharge status: Home / Self Care | Payer: BLUE CROSS/BLUE SHIELD | Attending: Emergency Medicine | Admitting: Emergency Medicine

## 2016-09-10 DIAGNOSIS — R51 Headache: Secondary | ICD-10-CM | POA: Insufficient documentation

## 2016-09-10 DIAGNOSIS — R519 Headache, unspecified: Secondary | ICD-10-CM

## 2016-09-10 DIAGNOSIS — I10 Essential (primary) hypertension: Secondary | ICD-10-CM | POA: Insufficient documentation

## 2016-09-10 DIAGNOSIS — Z79899 Other long term (current) drug therapy: Secondary | ICD-10-CM | POA: Diagnosis not present

## 2016-09-10 LAB — I-STAT CHEM 8, ED
BUN: 13 mg/dL (ref 6–20)
Calcium, Ion: 1.03 mmol/L — ABNORMAL LOW (ref 1.15–1.40)
Chloride: 105 mmol/L (ref 101–111)
Creatinine, Ser: 0.6 mg/dL (ref 0.44–1.00)
GLUCOSE: 86 mg/dL (ref 65–99)
HEMATOCRIT: 44 % (ref 36.0–46.0)
HEMOGLOBIN: 15 g/dL (ref 12.0–15.0)
POTASSIUM: 3.7 mmol/L (ref 3.5–5.1)
Sodium: 138 mmol/L (ref 135–145)
TCO2: 22 mmol/L (ref 0–100)

## 2016-09-10 MED ORDER — ACETAMINOPHEN 325 MG PO TABS
650.0000 mg | ORAL_TABLET | Freq: Once | ORAL | Status: AC
  Administered 2016-09-10: 650 mg via ORAL
  Filled 2016-09-10: qty 2

## 2016-09-10 MED ORDER — METOCLOPRAMIDE HCL 5 MG/ML IJ SOLN
10.0000 mg | Freq: Once | INTRAMUSCULAR | Status: AC
  Administered 2016-09-10: 10 mg via INTRAVENOUS
  Filled 2016-09-10: qty 2

## 2016-09-10 MED ORDER — SODIUM CHLORIDE 0.9 % IV BOLUS (SEPSIS)
1000.0000 mL | Freq: Once | INTRAVENOUS | Status: AC
  Administered 2016-09-10: 1000 mL via INTRAVENOUS

## 2016-09-10 MED ORDER — DIPHENHYDRAMINE HCL 50 MG/ML IJ SOLN
25.0000 mg | Freq: Once | INTRAMUSCULAR | Status: AC
  Administered 2016-09-10: 25 mg via INTRAVENOUS
  Filled 2016-09-10: qty 1

## 2016-09-10 NOTE — ED Triage Notes (Signed)
Pt complains of hypertension and headache since yesterday. Pt states she took her BP medication and ibuprofen but states pain is not getting better.

## 2016-09-10 NOTE — ED Provider Notes (Signed)
[facility]-EMERGENCY DEPT Provider Note   CSN: [id number] Arrival date & time: 09/10/16  1743     History   Chief Complaint Chief Complaint  Patient presents with  . Hypertension  . Headache    HPI Rachel Ritter is a 26 y.o. female.  Rachel Ritter is a 26 y.o. Female with a history of hypertension who presents to the emergency department complaining of a headache since yesterday. Patient reports gradual onset of a right-sided temporal headache that has been persistent since yesterday. She reports that initially she believed this is due to not taking her blood pressure medicine, however she restarted her blood pressure medicine 4 days ago. She tells me she's been noncompliant with her blood pressure medicine for quite a while, until 4 days ago she restarted taking this again. She reports pain to her right temporal area. She ihas taken ibuprofen without relief of her symptoms. She denies any recent changes to her blood pressure medications. She reports some sneezing and ear congestion. She denies fevers, head injury, numbness, tingling, weakness, neck pain, back pain, double vision, eye pain, neck stiffness, chest pain, shortness of breath, or rashes. LMP 09/08/16.    The history is provided by the patient, medical records and the spouse. No language interpreter was used.  Hypertension  Associated symptoms include headaches. Pertinent negatives include no chest pain, no abdominal pain and no shortness of breath.  Headache   Pertinent negatives include no fever, no palpitations, no shortness of breath, no nausea and no vomiting.    Past Medical History:  Diagnosis Date  . Medical history non-contributory   . Pregnancy induced hypertension     Patient Active Problem List   Diagnosis Date Noted  . Pre-eclampsia in postpartum period 02/26/2016  . Status post vaginal delivery 02/23/2016    Past Surgical History:  Procedure Laterality Date  . NO PAST SURGERIES      OB History    Gravida Para Term Preterm AB Living   SAB TAB Ectopic Multiple Live Births         0 1       Home Medications    Prior to Admission medications   Medication Sig Start Date End Date Taking? Authorizing Provider  enalapril (VASOTEC) 10 MG tablet Take 1 tablet (10 mg total) by mouth 2 (two) times daily. 03/01/16  Yes Lisa A Leftwich-Kirby, CNM  ibuprofen (ADVIL,MOTRIN) 200 MG tablet Take 600 mg by mouth every 6 (six) hours as needed for moderate pain.   Yes Historical Provider, MD  clindamycin (CLEOCIN) 300 MG capsule Take 1 capsule (300 mg total) by mouth 3 (three) times daily. Patient not taking: Reported on 05/11/2016 03/01/16   Misty Stanley A Leftwich-Kirby, CNM  enalapril (VASOTEC) 10 MG tablet TAKE 1 TABLET BY MOUTH DAILY Patient not taking: Reported on 09/10/2016 09/01/16   Tereso Newcomer, MD  Guaifenesin 1200 MG TB12 Take 1 tablet (1,200 mg total) by mouth 2 (two) times daily. Patient not taking: Reported on 09/10/2016 06/15/16   Charlestine Night, PA-C  hydrocortisone-pramoxine (PROCTOFOAM [facility]) rectal foam Place 1 applicator rectally 2 (two) times daily. Patient not taking: Reported on 05/11/2016 02/13/16   Marlis Edelson, CNM  ibuprofen (ADVIL,MOTRIN) 800 MG tablet Take 1 tablet (800 mg total) by mouth 3 (three) times daily. Patient not taking: Reported on 06/15/2016 05/11/16   Fayrene Helper, PA-C  metroNIDAZOLE (FLAGYL) 500 MG tablet Take 1 tablet (500 mg total) by mouth  2 (two) times daily. Patient not taking: Reported on 06/15/2016 05/11/16   Fayrene Helper, PA-C  NIFEdipine (PROCARDIA-XL/ADALAT CC) 60 MG 24 hr tablet Take 1 tablet (60 mg total) by mouth daily. Patient not taking: Reported on 05/11/2016 02/29/16   Tereso Newcomer, MD  promethazine-dextromethorphan (PROMETHAZINE-DM) 6.25-15 MG/5ML syrup Take 5 mLs by mouth 4 (four) times daily as needed for cough. Patient not taking: Reported on 09/10/2016 06/15/16   Charlestine Night, PA-C    Family History Family History  Problem  Relation Age of Onset  . Hypertension Mother   . Hypertension Sister   . Hypertension Maternal Aunt   . Hypertension Maternal Grandfather   . Diabetes Paternal Grandmother   . Stroke Paternal Grandmother     Social History Social History  Substance Use Topics  . Smoking status: Never Smoker  . Smokeless tobacco: Never Used  . Alcohol use No     Allergies   Amoxicillin   Review of Systems Review of Systems  Constitutional: Negative for chills and fever.  HENT: Positive for sneezing. Negative for congestion, ear discharge, hearing loss and sore throat.   Eyes: Negative for photophobia, pain and visual disturbance.  Respiratory: Negative for cough and shortness of breath.   Cardiovascular: Negative for chest pain and palpitations.  Gastrointestinal: Negative for abdominal pain, diarrhea, nausea and vomiting.  Genitourinary: Negative for dysuria.  Musculoskeletal: Negative for back pain and neck pain.  Skin: Negative for rash.  Neurological: Positive for headaches. Negative for dizziness, syncope, weakness, light-headedness and numbness.     Physical Exam Updated Vital Signs BP (!) 134/95   Pulse 75   Temp 97.7 F (36.5 C) (Oral)   Resp 19   Wt 99.8 kg   SpO2 96%   BMI 36.61 kg/m   Physical Exam  Constitutional: She is oriented to person, place, and time. She appears well-developed and well-nourished. No distress.  Nontoxic appearing.  HENT:  Head: Normocephalic and atraumatic.  Right Ear: External ear normal.  Left Ear: External ear normal.  Mouth/Throat: Oropharynx is clear and moist. No oropharyngeal exudate.  No temporal edema or tenderness bilaterally. Boggy nasal turbinates and rhinorrhea present. Mild clear middle ear effusion noted bilaterally. No TM erythema or loss of landmarks bilaterally. Throat is clear.  Eyes: Conjunctivae and EOM are normal. Pupils are equal, round, and reactive to light. Right eye exhibits no discharge. Left eye exhibits no  discharge.  Neck: Normal range of motion. Neck supple. No JVD present. No tracheal deviation present.  No meningeal signs.  Cardiovascular: Normal rate, regular rhythm, normal heart sounds and intact distal pulses.  Exam reveals no gallop and no friction rub.   No murmur heard. Pulmonary/Chest: Effort normal and breath sounds normal. No stridor. No respiratory distress. She has no wheezes. She has no rales.  Abdominal: Soft. Bowel sounds are normal. There is no tenderness. There is no guarding.  Musculoskeletal: Normal range of motion. She exhibits no edema or tenderness.  Patient is spontaneously moving all extremities in a coordinated fashion exhibiting good strength.   Lymphadenopathy:    She has no cervical adenopathy.  Neurological: She is alert and oriented to person, place, and time. No cranial nerve deficit or sensory deficit. She exhibits normal muscle tone. Coordination normal.  The patient is alert and oriented 3. Cranial nerves are intact. Speech is clear and coherent. No pronator drift. Finger to nose intact bilaterally. Vision is grossly intact. EOMs are intact. Normal gait. Sensation is intact to her bilateral upper and  lower extremities. Good and equal grip strengths bilaterally.  Skin: Skin is warm and dry. Capillary refill takes less than 2 seconds. No rash noted. She is not diaphoretic. No erythema. No pallor.  Psychiatric: She has a normal mood and affect. Her behavior is normal.  Nursing note and vitals reviewed.    ED Treatments / Results  Labs (all labs ordered are listed, but only abnormal results are displayed) Labs Reviewed  I-STAT CHEM 8, ED - Abnormal; Notable for the following:       Result Value   Calcium, Ion 1.03 (*)    All other components within normal limits    EKG  EKG Interpretation None       Radiology No results found.  Procedures Procedures (including critical care time)  Medications Ordered in ED Medications  sodium chloride 0.9 %  bolus 1,000 mL (0 mLs Intravenous Stopped 09/10/16 2217)  metoCLOPramide (REGLAN) injection 10 mg (10 mg Intravenous Given 09/10/16 2124)  diphenhydrAMINE (BENADRYL) injection 25 mg (25 mg Intravenous Given 09/10/16 2124)  acetaminophen (TYLENOL) tablet 650 mg (650 mg Oral Given 09/10/16 2112)     Initial Impression / Assessment and Plan / ED Course  I have reviewed the triage vital signs and the nursing notes.  Pertinent labs & imaging results that were available during my care of the patient were reviewed by me and considered in my medical decision making (see chart for details).    This is a 26 y.o. Female with a history of hypertension who presents to the emergency department complaining of a headache since yesterday. Patient reports gradual onset of a right-sided temporal headache that has been persistent since yesterday. She reports that initially she believed this is due to not taking her blood pressure medicine, however she restarted her blood pressure medicine 4 days ago. She tells me she's been noncompliant with her blood pressure medicine for quite a while, until 4 days ago she restarted taking this again.  Presentation is not concerning for [facility], ICH, Meningitis, or temporal arteritis. Pt is afebrile with no focal neuro deficits, nuchal rigidity, or change in vision. Patient has not had any chest pain, shortness of breath, abdominal pain, back pain lower extremity edema or changes in urinary frequency beyond normal. Has been taking blood pressure medications as directed by the primary doctor. Exam does not show any evidence of neurologic changes, fluid overload, vision changes or other acute issues secondary to hypertension. As ACEP guidelines recommend not acutely lowering BP and having close PCP follow-up for asymptomatic hypertension I suggested that they keep a log of their blood pressures for the next 5-7 days and follow-up with her primary doctor for any medication changes. They know to  return to the emergency department for any onset of the symptoms as listed above. Carter discharge patient reports her headache has resolved. Blood pressure is 134/95 prior to discharge. Chem-8 is unremarkable. I discussed return precautions. I advised the patient to follow-up with their primary care provider this week. I advised the patient to return to the emergency department with new or worsening symptoms or new concerns. The patient verbalized understanding and agreement with plan.      Final Clinical Impressions(s) / ED Diagnoses   Final diagnoses:  Bad headache  Essential hypertension    New Prescriptions New Prescriptions   No medications on file     Everlene Farrier, Cordelia Poche 09/10/16 2221    Marily Memos, MD 09/10/16 2231

## 2016-10-06 ENCOUNTER — Other Ambulatory Visit (HOSPITAL_COMMUNITY): Payer: Self-pay | Admitting: Obstetrics & Gynecology

## 2016-10-16 ENCOUNTER — Other Ambulatory Visit (HOSPITAL_COMMUNITY): Payer: Self-pay | Admitting: Obstetrics & Gynecology

## 2017-03-06 IMAGING — CR DG CHEST 2V
2 series · 2 of 2 positions shown · non-contrast
Comparison: None.

CLINICAL DATA: Short of breath.  Postpartum

EXAM:
CHEST  2 VIEW

[chest pa]
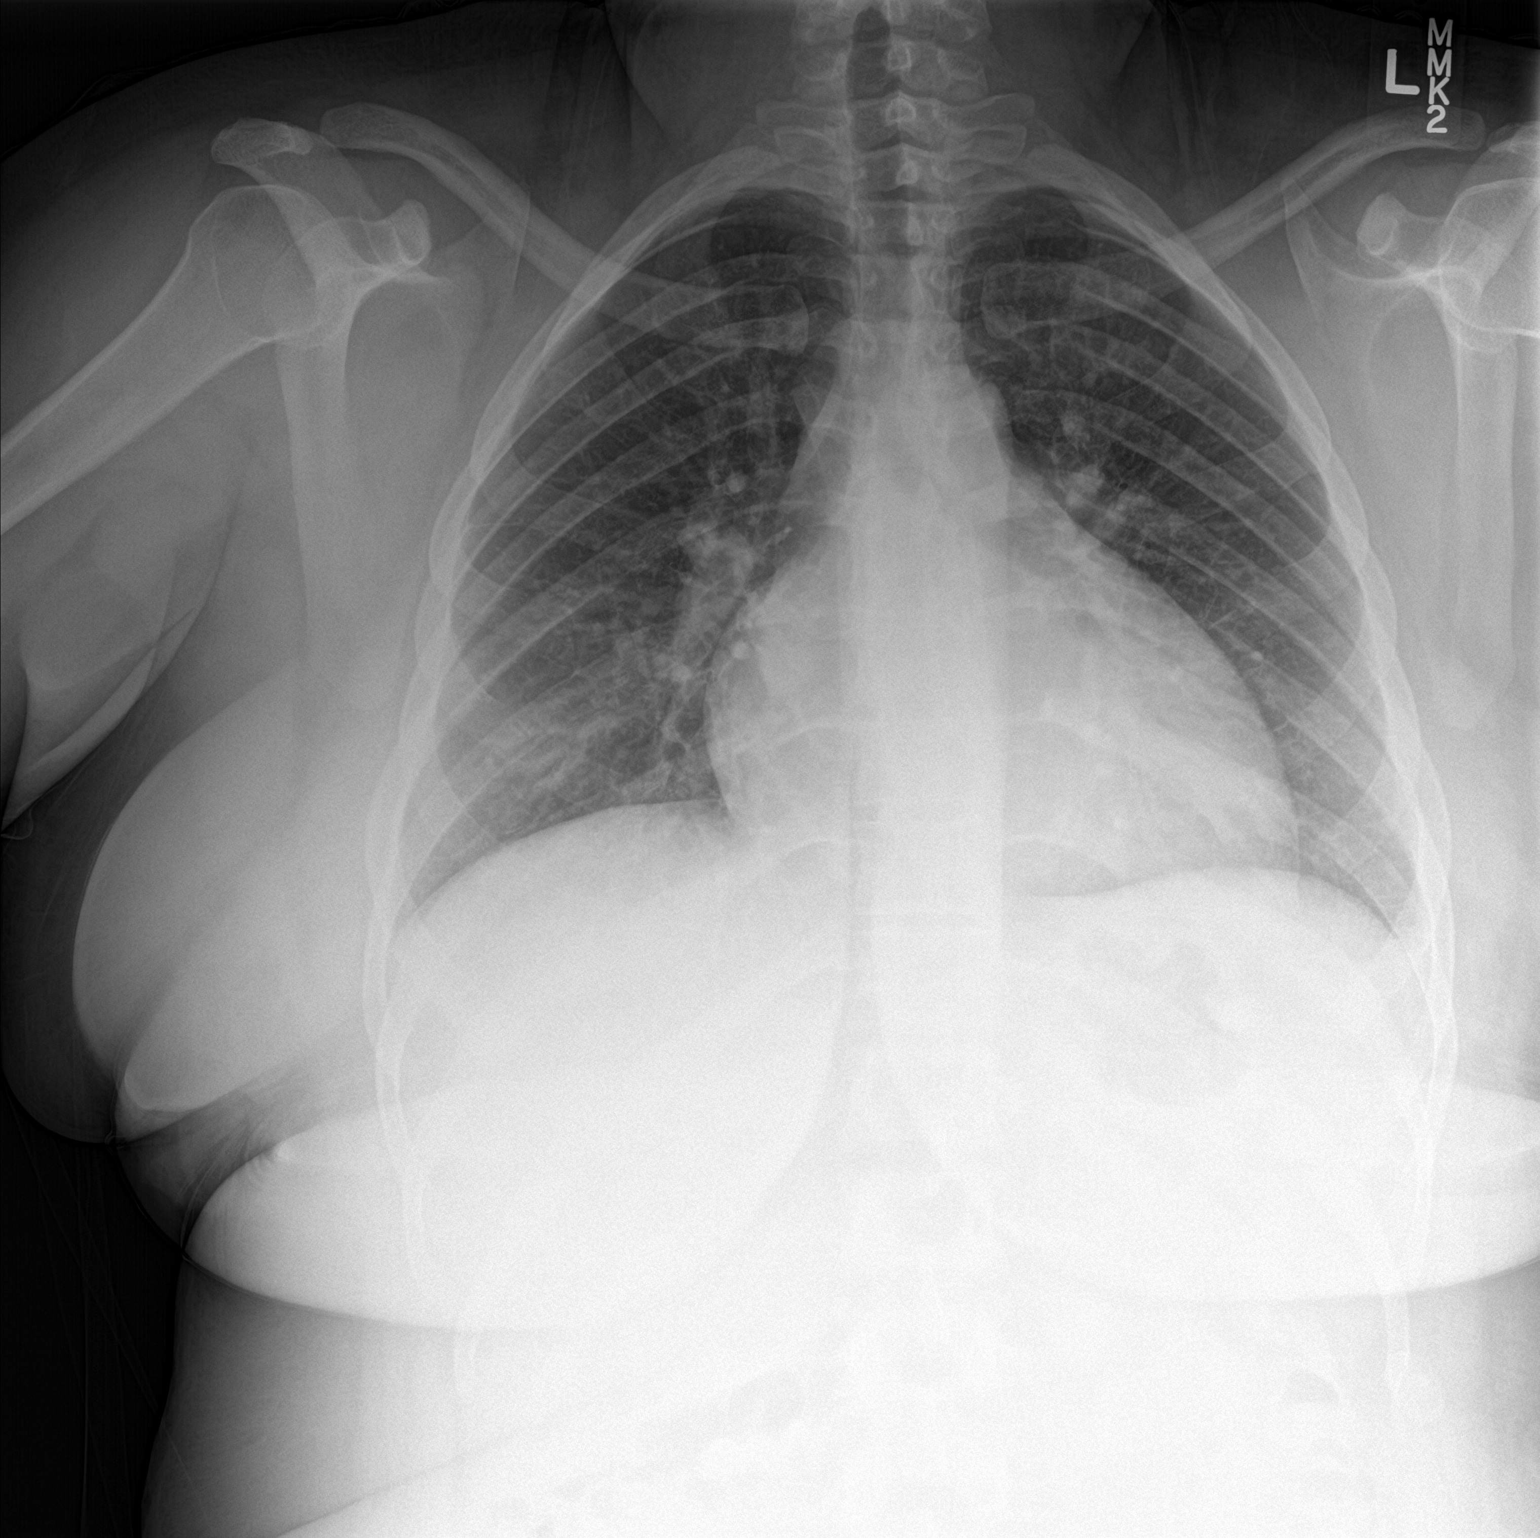

[chest lat]
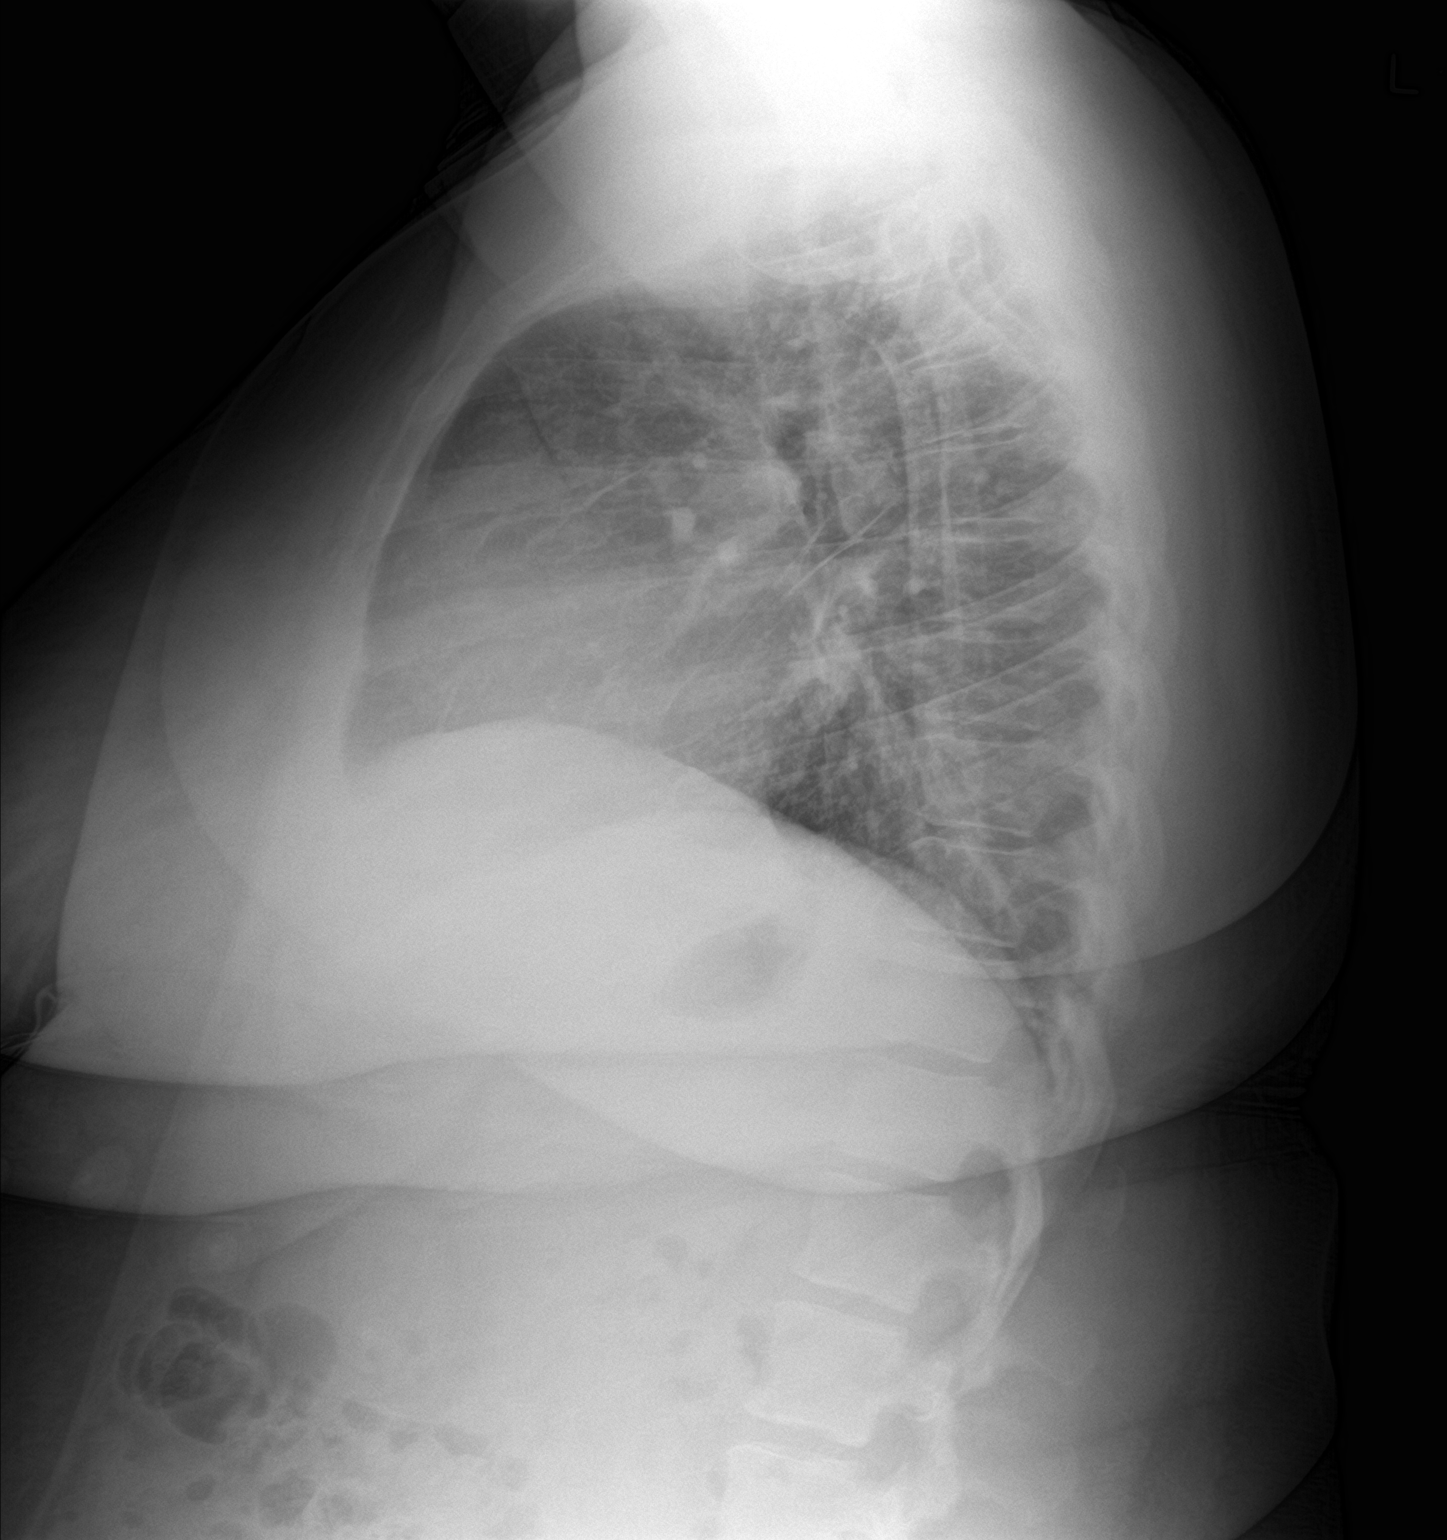

[2 of 2 positions shown; findings below may reference images not displayed]

FINDINGS: Cardiac enlargement without heart failure or edema. Negative for
pneumonia or effusion. Lungs are clear.
IMPRESSION: Cardiac enlargement.  No heart failure or pneumonia.

## 2017-06-08 ENCOUNTER — Other Ambulatory Visit: Payer: Self-pay

## 2017-06-08 ENCOUNTER — Emergency Department: Payer: BLUE CROSS/BLUE SHIELD

## 2017-06-08 ENCOUNTER — Emergency Department
Admission: EM | Admit: 2017-06-08 | Discharge: 2017-06-08 | Disposition: A | Discharge status: Home / Self Care | Payer: BLUE CROSS/BLUE SHIELD | Attending: Emergency Medicine | Admitting: Emergency Medicine

## 2017-06-08 DIAGNOSIS — Z79899 Other long term (current) drug therapy: Secondary | ICD-10-CM | POA: Insufficient documentation

## 2017-06-08 DIAGNOSIS — R079 Chest pain, unspecified: Secondary | ICD-10-CM

## 2017-06-08 DIAGNOSIS — R0602 Shortness of breath: Secondary | ICD-10-CM | POA: Diagnosis not present

## 2017-06-08 LAB — BASIC METABOLIC PANEL
ANION GAP: 6 (ref 5–15)
BUN: 15 mg/dL (ref 6–20)
CHLORIDE: 105 mmol/L (ref 101–111)
CO2: 26 mmol/L (ref 22–32)
CREATININE: 0.57 mg/dL (ref 0.44–1.00)
Calcium: 8.6 mg/dL — ABNORMAL LOW (ref 8.9–10.3)
GFR calc non Af Amer: >60 mL/min (ref >60)
Glucose, Bld: 119 mg/dL — ABNORMAL HIGH (ref 65–99)
Potassium: 4 mmol/L (ref 3.5–5.1)
Sodium: 137 mmol/L (ref 135–145)

## 2017-06-08 LAB — CBC
HEMATOCRIT: 38.5 % (ref 35.0–47.0)
HEMOGLOBIN: 12.7 g/dL (ref 12.0–16.0)
MCH: 27.4 pg (ref 26.0–34.0)
MCHC: 33 g/dL (ref 32.0–36.0)
MCV: 83 fL (ref 80.0–100.0)
Platelets: 275 10*3/uL (ref 150–440)
RBC: 4.64 MIL/uL (ref 3.80–5.20)
RDW: 14.6 % — ABNORMAL HIGH (ref 11.5–14.5)
WBC: 6.2 10*3/uL (ref 3.6–11.0)

## 2017-06-08 LAB — POCT PREGNANCY, URINE: PREG TEST UR: NEGATIVE

## 2017-06-08 LAB — TROPONIN I

## 2017-06-08 NOTE — ED Triage Notes (Signed)
Patient reports symptoms that come and go for the past week.  Reports feeling short of breath which causes pain in upper chest.  Denies radiation, cough or other accompanying symptoms.

## 2017-06-08 NOTE — ED Notes (Signed)
ED Provider at bedside. 

## 2017-06-08 NOTE — ED Provider Notes (Signed)
[facility] Emergency Department Provider Note   ____________________________________________   First MD Initiated Contact with Patient 06/08/17 [phone]     (approximate)  I have reviewed the triage vital signs and the nursing notes.   HISTORY  Chief Complaint Chest Pain and Shortness of Breath    HPI Rachel Ritter is a 27 y.o. female who comes into the hospital today with some chest pain and shortness of breath.  The patient states that she had lost her blood pressure pills and then did not take them for some time.  She reports that recently started taking them again but when she started back about a week ago she started having some palpitations and feeling like she loses her breath and has chest pain.  She reports that this is mainly when she is going to sleep.  She reports that the pain goes into her back as well for the past week.  She states that she scheduled an appointment with her doctor for 8 AM today but it was bad tonight so she came in for evaluation.  The patient states that the pain is gone now and it seems to come and go.  She denies any nausea, vomiting, dizziness, lightheadedness.  She reports that the pain does not get worse when she takes a deep breath but it is there when she feels short of breath.  She is here today for evaluation.   Past Medical History:  Diagnosis Date  . Medical history non-contributory   . Pregnancy induced hypertension     Patient Active Problem List   Diagnosis Date Noted  . Pre-eclampsia in postpartum period 02/26/2016  . Status post vaginal delivery 02/23/2016    Past Surgical History:  Procedure Laterality Date  . NO PAST SURGERIES      Prior to Admission medications   Medication Sig Start Date End Date Taking? Authorizing Provider  clindamycin (CLEOCIN) 300 MG capsule Take 1 capsule (300 mg total) by mouth 3 (three) times daily. Patient not taking: Reported on 05/11/2016 03/01/16   Sharen Counter  A, CNM  enalapril (VASOTEC) 10 MG tablet Take 1 tablet (10 mg total) by mouth 2 (two) times daily. 03/01/16   Leftwich-Kirby, Wilmer Floor, CNM  enalapril (VASOTEC) 10 MG tablet TAKE 1 TABLET BY MOUTH DAILY Patient not taking: Reported on 09/10/2016 09/01/16   Anyanwu, Jethro Bastos, MD  Guaifenesin 1200 MG TB12 Take 1 tablet (1,200 mg total) by mouth 2 (two) times daily. Patient not taking: Reported on 09/10/2016 06/15/16   Lawyer, Cristal Deer, PA-C  hydrocortisone-pramoxine (PROCTOFOAM [facility]) rectal foam Place 1 applicator rectally 2 (two) times daily. Patient not taking: Reported on 05/11/2016 02/13/16   Marlis Edelson, CNM  ibuprofen (ADVIL,MOTRIN) 200 MG tablet Take 600 mg by mouth every 6 (six) hours as needed for moderate pain.    [provider]  ibuprofen (ADVIL,MOTRIN) 800 MG tablet Take 1 tablet (800 mg total) by mouth 3 (three) times daily. Patient not taking: Reported on 06/15/2016 05/11/16   Fayrene Helper, PA-C  metroNIDAZOLE (FLAGYL) 500 MG tablet Take 1 tablet (500 mg total) by mouth 2 (two) times daily. Patient not taking: Reported on 06/15/2016 05/11/16   Fayrene Helper, PA-C  NIFEdipine (PROCARDIA-XL/ADALAT CC) 60 MG 24 hr tablet Take 1 tablet (60 mg total) by mouth daily. Patient not taking: Reported on 05/11/2016 02/29/16   Tereso Newcomer, MD  promethazine-dextromethorphan (PROMETHAZINE-DM) 6.25-15 MG/5ML syrup Take 5 mLs by mouth 4 (four) times daily as needed for cough.  Patient not taking: Reported on 09/10/2016 06/15/16   Charlestine NightLawyer, Christopher, PA-C    Allergies Amoxicillin  Family History  Problem Relation Age of Onset  . Hypertension Mother   . Hypertension Sister   . Hypertension Maternal Aunt   . Hypertension Maternal Grandfather   . Diabetes Paternal Grandmother   . Stroke Paternal Grandmother     Social History Social History   Tobacco Use  . Smoking status: Never Smoker  . Smokeless tobacco: Never Used  Substance Use Topics  . Alcohol use: No  . Drug use: No     Review of Systems  Constitutional: No fever/chills Eyes: No visual changes. ENT: No sore throat. Cardiovascular:  chest pain. Respiratory:  shortness of breath. Gastrointestinal: No abdominal pain.  No nausea, no vomiting.  No diarrhea.  No constipation. Genitourinary: Negative for dysuria. Musculoskeletal: back pain. Skin: Negative for rash. Neurological: Negative for headaches, focal weakness or numbness.   ____________________________________________   PHYSICAL EXAM:  VITAL SIGNS: ED Triage Vitals  Enc Vitals Group     BP 06/08/17 0033 (!) 147/88     Pulse Rate 06/08/17 0033 (!) 103     Resp 06/08/17 0033 16     Temp 06/08/17 0033 98.1 F (36.7 C)     Temp Source 06/08/17 0033 Oral     SpO2 06/08/17 0033 99 %     Weight --      Height --      Head Circumference --      Peak Flow --      Pain Score 06/08/17 0036 7     Pain Loc --      Pain Edu? --      Excl. in GC? --     Constitutional: Alert and oriented. Well appearing and in no acute distress. Eyes: Conjunctivae are normal. PERRL. EOMI. Head: Atraumatic. Nose: No congestion/rhinnorhea. Mouth/Throat: Mucous membranes are moist.  Oropharynx non-erythematous. Cardiovascular: Normal rate, regular rhythm. Grossly normal heart sounds.  Good peripheral circulation. Respiratory: Normal respiratory effort.  No retractions. Lungs CTAB. Gastrointestinal: Soft and nontender. No distention. Positive bowel sounds Musculoskeletal: No lower extremity tenderness nor edema.   Neurologic:  Normal speech and language. No gross focal neurologic deficits are appreciated. No gait instability. Skin:  Skin is warm, dry and intact. No rash noted. Psychiatric: Mood and affect are normal. Speech and behavior are normal.  ____________________________________________   LABS (all labs ordered are listed, but only abnormal results are displayed)  Labs Reviewed  CBC - Abnormal; Notable for the following components:      Result  Value   RDW 14.6 (*)    All other components within normal limits  BASIC METABOLIC PANEL - Abnormal; Notable for the following components:   Glucose, Bld 119 (*)    Calcium 8.6 (*)    All other components within normal limits  TROPONIN I  POCT PREGNANCY, URINE  POC URINE PREG, ED   ____________________________________________  EKG  ED ECG REPORT I, Rebecka ApleyWebster,  Izela Altier P, the attending physician, personally viewed and interpreted this ECG.   Date: 06/08/2017  EKG Time: 0035  Rate: 97  Rhythm: normal sinus rhythm  Axis: normal  Intervals:none  ST&T Change: none  ____________________________________________  RADIOLOGY  Dg Chest 2 View  Result Date: 06/08/2017 CLINICAL DATA:  Shortness of breath. EXAM: CHEST  2 VIEW COMPARISON:  06/15/2016 FINDINGS: The cardiomediastinal contours are normal. The lungs are clear. Pulmonary vasculature is normal. No consolidation, pleural effusion, or pneumothorax. No acute osseous abnormalities  are seen. IMPRESSION: No acute pulmonary process. Electronically Signed   By: Rubye Oaks M.D.   On: 06/08/2017 01:57    ____________________________________________   PROCEDURES  Procedure(s) performed: None  Procedures  Critical Care performed: No  ____________________________________________   INITIAL IMPRESSION / ASSESSMENT AND PLAN / ED COURSE  As part of my medical decision making, I reviewed the following data within the electronic MEDICAL RECORD NUMBER Notes from prior ED visits and [place] Controlled Substance Database   This is a 27 year old female who comes into the hospital today with some chest pain and shortness of breath.  My differential diagnosis includes pneumonia, acute coronary syndrome, pulmonary embolism, gastritis  The patient is not having pleuritic pain and her pain is gone at this time so it decreases pulmonary embolism in my differential.  The patient had an EKG which was unremarkable and I did check some blood work with  a troponin which is negative.  The patient also had a chest x-ray which was unremarkable.  Her pain at this time is gone.  Since the patient's workup is unremarkable and her pain is gone I will discharge the patient to home to have her follow-up with her primary care physician.  She reports that she has an appointment today at 8 AM.  The patient will be discharged home.      ____________________________________________   FINAL CLINICAL IMPRESSION(S) / ED DIAGNOSES  Final diagnoses:  Chest pain, unspecified type  Shortness of breath     ED Discharge Orders    None       Note:  This document was prepared using Dragon voice recognition software and may include unintentional dictation errors.    Rebecka Apley, MD 06/08/17 [phone]

## 2017-06-08 NOTE — Discharge Instructions (Signed)
PLease follow up with your physician as you have scheduled today

## 2018-11-08 ENCOUNTER — Encounter: Payer: Self-pay | Admitting: Neurology

## 2018-11-08 ENCOUNTER — Telehealth: Payer: Self-pay | Admitting: Neurology

## 2018-11-08 NOTE — Telephone Encounter (Signed)

## 2018-11-08 NOTE — Telephone Encounter (Signed)

## 2018-11-09 ENCOUNTER — Other Ambulatory Visit: Payer: Self-pay

## 2018-11-09 ENCOUNTER — Ambulatory Visit (INDEPENDENT_AMBULATORY_CARE_PROVIDER_SITE_OTHER): Payer: 59 | Admitting: Neurology

## 2018-11-09 ENCOUNTER — Encounter (HOSPITAL_COMMUNITY): Payer: Self-pay | Admitting: *Deleted

## 2018-11-09 ENCOUNTER — Encounter: Payer: Self-pay | Admitting: Neurology

## 2018-11-09 DIAGNOSIS — Z72821 Inadequate sleep hygiene: Secondary | ICD-10-CM | POA: Diagnosis not present

## 2018-11-09 DIAGNOSIS — O1495 Unspecified pre-eclampsia, complicating the puerperium: Secondary | ICD-10-CM

## 2018-11-09 DIAGNOSIS — R0683 Snoring: Secondary | ICD-10-CM | POA: Diagnosis not present

## 2018-11-09 DIAGNOSIS — I10 Essential (primary) hypertension: Secondary | ICD-10-CM | POA: Diagnosis not present

## 2018-11-09 DIAGNOSIS — D508 Other iron deficiency anemias: Secondary | ICD-10-CM

## 2018-11-09 DIAGNOSIS — D649 Anemia, unspecified: Secondary | ICD-10-CM | POA: Insufficient documentation

## 2018-11-09 DIAGNOSIS — E6609 Other obesity due to excess calories: Secondary | ICD-10-CM | POA: Diagnosis not present

## 2018-11-09 NOTE — Patient Instructions (Signed)
Screening for Sleep Apnea  Sleep apnea is a condition in which breathing pauses or becomes shallow during sleep. Sleep apnea screening is a test to determine if you are at risk for sleep apnea. The test is easy and only takes a few minutes. Your health care provider may ask you to have this test in preparation for surgery or as part of a physical exam. What are the symptoms of sleep apnea? Common symptoms of sleep apnea include:  Snoring.  Restless sleep.  Daytime sleepiness.  Pauses in breathing.  Choking during sleep.  Irritability.  Forgetfulness.  Trouble thinking clearly.  Depression.  Personality changes. Most people with sleep apnea are not aware that they have it. Why should I get screened? Getting screened for sleep apnea can help:  Ensure your safety. It is important for your health care providers to know whether or not you have sleep apnea, especially if you are having surgery or have other long-term (chronic) health conditions.  Improve your health and allow you to get a better night's rest. Restful sleep can help you: ? Have more energy. ? Lose weight. ? Improve high blood pressure. ? Improve diabetes management. ? Prevent stroke. ? Prevent car accidents. How is screening done? Screening usually includes being asked a list of questions about your sleep quality. Some questions you may be asked include:  Do you snore?  Is your sleep restless?  Do you have daytime sleepiness?  Has a partner or spouse told you that you stop breathing during sleep?  Have you had trouble concentrating or memory loss? If your screening test is positive, you are at risk for the condition. Further testing may be needed to confirm a diagnosis of sleep apnea. Where to find more information You can find screening tools online or at your health care clinic. For more information about sleep apnea screening and healthy sleep, visit these websites:  Centers for Disease Control and  Prevention: [employer]pl  American Sleep Apnea Association: www.sleepapnea.org Contact a health care provider if:  You think that you may have sleep apnea. Summary  Sleep apnea screening can help determine if you are at risk for sleep apnea.  It is important for your health care providers to know whether or not you have sleep apnea, especially if you are having surgery or have other chronic health conditions.  You may be asked to take a screening test for sleep apnea in preparation for surgery or as part of a physical exam. This information is not intended to replace advice given to you by your health care provider. Make sure you discuss any questions you have with your health care provider. Document Released: 08/29/2016 Document Revised: 08/29/2016 Document Reviewed: 08/29/2016 Elsevier Interactive Patient Education  2019 [employer].     Please remember to try to maintain good sleep hygiene, which means: Keep a regular sleep and wake schedule, try not to exercise or have a meal within 2 hours of your bedtime, try to keep your bedroom conducive for sleep, that is, cool and dark, without light distractors such as an illuminated alarm clock, and refrain from watching TV right before sleep or in the middle of the night and do not keep the TV or radio on during the night. Also, try not to use or play on electronic devices at bedtime, such as your cell phone, tablet PC or laptop. If you like to read at bedtime on an electronic device, try to dim the background light as much as possible. Do not  eat in the middle of the night.

## 2018-11-09 NOTE — Progress Notes (Signed)
SLEEP MEDICINE CLINIC   Provider:  Melvyn Novasarmen  Kaz Auld, M D  Primary Care Physician:  Lewis Moccasinewey, Elizabeth R, MD   Referring Provider:  same  Virtual Visit via Video Note  I connected with@ on 11/09/18 at  3:00 PM EDT by a video enabled telemedicine application and verified that I am speaking with the correct person using two identifiers.  Location: Patient: at home  Provider: [facility]   I discussed the limitations of evaluation and management by telemedicine and the availability of in person appointments. The patient expressed understanding and agreed to proceed.  Melvyn Novasarmen Chevi Lim, MD  HPI:  Rachel Ritter is a 28 y.o. female , seen here as in a referral/ revisit  from Dr. Duanne Guessewey for a sleep evaluation.  Chief complaint according to patient :" I am concerned that poor sleep may contribute to HTN"   Sleep and medical history: Dr Duanne Guessewey mentioned HTN on 3 medications, allergies, possible anemia, weight gain, and snoring since early childhood. ENT evaluation in elementary school did not show anything.    Family sleep and medical history: no apnea in family members, insomnia in her mother. Mother has HTN, father has HTN. Paternal GM had DM.    Social history: married, 27 year old son, lives in a household with 5 adults, 2 children and one teenager.  Working from home- no tobacco use, no ETOH- use at all. Caffeine : 1 a day.  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 [facility] .   Sleep habits are as follows:  Dinner time is at 6-9 PM, depending on sports and work hours.  Bedtime 8 Pm - 12,midnight - the same variability for the children.  Bedroom is neither cool, nor quiet nor dark.  28 year old invades the bedroom, husband watching TV in bed. Sleep position is side and on 3-5 pillows.  She sleeps 3 hours before having to go to the bathroom. Can go back easily.  Her alarm rings between 7 and 8 AM. She sleeps through up to that point, wakes  not refreshed or restored.  She takes no naps.    Review of Systems: Out of a complete 14 system review, the patient complains of only the following symptoms, and all other reviewed systems are negative. Snoring, non restful sleep.   How likely are you to doze in the following situations: 0 = not likely, 1 = slight chance, 2 = moderate chance, 3 = high chance  Sitting and Reading? Watching Television? Sitting inactive in a public place (theater or meeting)? Lying down in the afternoon when circumstances permit? Sitting and talking to someone? Sitting quietly after lunch without alcohol? In a car, while stopped for a few minutes in traffic? As a passenger in a car for an hour without a break?  Total = 10/24  Social History   Socioeconomic History  . Marital status: Married    Spouse name: Not on file  . Number of children: Not on file  . Years of education: Not on file  . Highest education level: Not on file  Occupational History  . Not on file  Social Needs  . Financial resource strain: Not on file  . Food insecurity:    Worry: Not on file    Inability: Not on file  . Transportation needs:    Medical: Not on file    Non-medical: Not on file  Tobacco Use  . Smoking status: Never Smoker  .  Smokeless tobacco: Never Used  Substance and Sexual Activity  . Alcohol use: Never    Frequency: Never  . Drug use: Never  . Sexual activity: Never    Birth control/protection: None  Lifestyle  . Physical activity:    Days per week: Not on file    Minutes per session: Not on file  . Stress: Not on file  Relationships  . Social connections:    Talks on phone: Not on file    Gets together: Not on file    Attends religious service: Not on file    Active member of club or organization: Not on file    Attends meetings of clubs or organizations: Not on file    Relationship status: Not on file  . Intimate partner violence:    Fear of current or ex partner: Not on file     Emotionally abused: Not on file    Physically abused: Not on file    Forced sexual activity: Not on file  Other Topics Concern  . Not on file  Social History Narrative   ** Merged History Encounter **        Family History  Problem Relation Age of Onset  . Brain cancer Maternal Grandmother   . Lung cancer Maternal Grandmother   . Leukemia Maternal Grandfather   . Diabetes Paternal Grandmother   . Stroke Paternal Grandmother   . Prostate cancer Paternal Grandfather   . Hypertension Mother   . Hypertension Sister   . Hypertension Maternal Aunt   . Hypertension Maternal Grandfather     Past Medical History:  Diagnosis Date  . Headache   . Hypertension   . Medical history non-contributory   . Pregnancy induced hypertension     Past Surgical History:  Procedure Laterality Date  . NO PAST SURGERIES      Current Outpatient Medications  Medication Sig Dispense Refill  . clindamycin (CLEOCIN) 300 MG capsule Take 1 capsule (300 mg total) by mouth 3 (three) times daily. (Patient not taking: Reported on 05/11/2016) 30 capsule 0  . enalapril (VASOTEC) 10 MG tablet Take 1 tablet (10 mg total) by mouth 2 (two) times daily. 60 tablet 1  . enalapril (VASOTEC) 10 MG tablet TAKE 1 TABLET BY MOUTH DAILY (Patient not taking: Reported on 09/10/2016) 30 tablet 0  . Guaifenesin 1200 MG TB12 Take 1 tablet (1,200 mg total) by mouth 2 (two) times daily. (Patient not taking: Reported on 09/10/2016) 20 each 0  . hydrocortisone-pramoxine (PROCTOFOAM HC) rectal foam Place 1 applicator rectally 2 (two) times daily. (Patient not taking: Reported on 05/11/2016) 10 g 0  . ibuprofen (ADVIL,MOTRIN) 200 MG tablet Take 600 mg by mouth every 6 (six) hours as needed for moderate pain.    Marland Kitchen. ibuprofen (ADVIL,MOTRIN) 800 MG tablet Take 1 tablet (800 mg total) by mouth 3 (three) times daily. (Patient not taking: Reported on 06/15/2016) 21 tablet 0  . loratadine (CLARITIN) 10 MG tablet Take 10 mg by mouth daily.    .  metroNIDAZOLE (FLAGYL) 500 MG tablet Take 1 tablet (500 mg total) by mouth 2 (two) times daily. (Patient not taking: Reported on 06/15/2016) 14 tablet 0  . Multiple Vitamin (MULTIVITAMIN) tablet Take 1 tablet by mouth daily.    Marland Kitchen. NIFEdipine (PROCARDIA-XL/ADALAT CC) 60 MG 24 hr tablet Take 1 tablet (60 mg total) by mouth daily. (Patient not taking: Reported on 05/11/2016) 30 tablet 3  . NORLYDA 0.35 MG tablet     . olmesartan-hydrochlorothiazide (BENICAR HCT) 20-12.5  MG tablet TK 1 T PO D    . promethazine-dextromethorphan (PROMETHAZINE-DM) 6.25-15 MG/5ML syrup Take 5 mLs by mouth 4 (four) times daily as needed for cough. (Patient not taking: Reported on 09/10/2016) 120 mL 0   No current facility-administered medications for this visit.     Allergies as of 11/09/2018 - Review Complete 11/08/2018  Allergen Reaction Noted  . Amoxicillin Hives 03/13/2014  . Amoxicillin  11/08/2018    Last Weight: 246#    SAY:TKZSW is no height or weight on file to calculate BMI.     Last Height: 5.5"   Observation:  General: The patient is awake, alert and appears not in acute distress. The patient is well groomed. Head: Normocephalic, atraumatic. Neck is supple without ROM restriction.  Mallampati grade :   Neck circumference: 15.5"  inches Nasal airflow is patent. Retrognathia is seen.  Respiratory: Skin:  Without evidence of facial edema or rash  Neurologic exam : The patient is awake and alert, oriented to place and time.   Attention span & concentration ability appears normal.  Speech is fluent, without dysarthria, dysphonia or aphasia.  Mood and affect are appropriate.  Cranial nerves: Pupils are equal in size and round.  Extraocular movements  in vertical and horizontal planes intact. Facial motor strength is symmetric and tongue and uvula move midline. Shoulder shrug was symmetrical.   Motor exam:   Normal muscle bulk and symmetric ROM ( range of movement) in upper extremities.  Coordination:  Rapid alternating movements in the fingers/hands were normal.  Gait and station: Patient walks without assistive device -   Assessment and Plan:  patient reports her husband has noted her snoring and having apnea , she is at risk for OSA given high BMI and her retrognathia. Larger neck size.   Her sleep quality may also be poor because of the missing routines, such as set bed time , rise time and the presence of electronics in the bed room.  I will order HST to screen for apnea and send the AVS along with the 14 days insomnia boot camp.    Follow Up Instructions: RV in 3 month if apnea is found    I discussed the assessment and treatment plan with the patient. The patient was provided an opportunity to ask questions and all were answered. The patient agreed with the plan and demonstrated an understanding of the instructions.   The patient was advised to call back or seek an in-person evaluation if the symptoms worsen or if the condition fails to improve as anticipated.  I provided 26 minutes of non-face-to-face time during this encounter.  Larey Seat, MD 1/0/9323, 5:57 PM  Certified in Neurology by ABPN Certified in [place] by [facility] [address], [place], [place] [address]

## 2018-12-01 ENCOUNTER — Ambulatory Visit: Payer: 59 | Admitting: Neurology

## 2018-12-01 ENCOUNTER — Other Ambulatory Visit: Payer: Self-pay

## 2018-12-29 ENCOUNTER — Ambulatory Visit (INDEPENDENT_AMBULATORY_CARE_PROVIDER_SITE_OTHER): Payer: 59 | Admitting: Neurology

## 2018-12-29 DIAGNOSIS — G4733 Obstructive sleep apnea (adult) (pediatric): Secondary | ICD-10-CM

## 2019-01-17 ENCOUNTER — Other Ambulatory Visit: Payer: Self-pay | Admitting: Neurology

## 2019-01-17 ENCOUNTER — Encounter: Payer: Self-pay | Admitting: Neurology

## 2019-01-17 ENCOUNTER — Telehealth: Payer: Self-pay | Admitting: Neurology

## 2019-01-17 DIAGNOSIS — Z72821 Inadequate sleep hygiene: Secondary | ICD-10-CM

## 2019-01-17 DIAGNOSIS — G4733 Obstructive sleep apnea (adult) (pediatric): Secondary | ICD-10-CM

## 2019-01-17 DIAGNOSIS — R0683 Snoring: Secondary | ICD-10-CM

## 2019-01-17 NOTE — Telephone Encounter (Signed)
I called pt. I advised pt that Dr. Brett Fairy reviewed their sleep study results and found that pt has sleep apnea. Dr. Brett Fairy recommends that pt auto CPAP. I reviewed PAP compliance expectations with the pt. Pt is agreeable to starting a CPAP. I advised pt that an order will be sent to a DME, [facility], and [facility] will call the pt within about one week after they file with the pt's insurance. [facility] will show the pt how to use the machine, fit for masks, and troubleshoot the CPAP if needed. A follow up appt was made for insurance purposes with Dr. Brett Fairy on Oct 13,2020 at 11:30 am. Pt verbalized understanding to arrive 15 minutes early and bring their CPAP. A letter with all of this information in it will be mailed to the pt as a reminder. I verified with the pt that the address we have on file is correct. Pt verbalized understanding of results. Pt had no questions at this time but was encouraged to call back if questions arise. I have sent the order to [facility] and have received confirmation that they have received the order.

## 2019-01-17 NOTE — Procedures (Signed)
  Patient Information     First Name: Rachel Last Name: Randol Ritter ID: [id number]  Birth Date: 1991-01-08 Age: 28 Gender: Female  Referring Provider: Rachell Cipro, MD BMI: 41.1 (W=247 lb, H=5' 5'')  Neck Circ.:  16 '' Epworth:  10/24   Sleep Study Information    Study Date: Dec 29, 2018 S/H/A Version: 001.001.001.001 / 4.1.1528 / 98  History:    Rachel Ritter is a 28 y.o. female seen on video on 11-09-2018 upon referral from Dr. Ernie Hew for a sleep evaluation. Dr. Ernie Hew listed the dx as: HTN on 3 medications, respiratory allergies, possible anemia, and morbid obesity after weight gain, and snoring since early childhood. ENT evaluation in elementary school did not show anything.  No apnea in family members, but insomnia in her mother. Mother has HTN, father has HTN. Paternal GM had DM.               Summary & Diagnosis:    Surprisingly mild sleep apnea (OSA) with an AHI of just 10.7/h.  Also surprising was the very strong REM accentuation to REM AHI of 82.3/h within a presumed REM proportion of 20% of the recorded sleep time.   Recommendations:      REM dependent apnea needs Positive Airway Pressure therapy and can always benefit from weight loss.  I will order an autotitration CAP device with heated humidity and mask of patient's choice and comfort with the setting of 6-16 cm water, 2 cm water EPR.   Electronically Signed: Larey Seat, MD   01-17-2019            Sleep Summary    Oxygen Saturation Statistics     Start Study Time: End Study Time: Total Recording Time:  10:56:17 PM 7:00:58 AM   8 h, 4 min  Total Sleep Time % REM of Sleep Time:  6 h, 57 min 20.6    Mean: 97 Minimum: 90 Maximum: 100  Mean of Desaturations Nadirs (%):   93  Oxygen Desaturation. %: 4-9 10-20 >20 Total  Events Number Total  8 100.0  0 0.0  0 0.0  8 100.0  Oxygen Saturation: <90 <=88 <85 <80 <70  Duration (minutes): Sleep % 0.0 0.0 0.0 0.0 0.0 0.0 0.0 0.0 0.0 0.0      Respiratory Indices      Total Events REM NREM All Night  pRDI:  36  pAHI:  27 ODI:  8  pAHIc:  0 % CSR: 0.0 82.3 82.3 30.9 0.0 11.5 7.8 2.1 0.0 14.2 10.7 3.2 0.0       Pulse Rate Statistics during Sleep (BPM)      Mean: 79 Minimum: 45 Maximum: 146    Indices are calculated using technically valid sleep time of  2 hrs, 32 min. pRDI/pAHI are calculated using oxi desaturations ? 3%  Body Position Statistics  Position Supine Prone Right Left Non-Supine  Sleep (min) 206.5 0.0 166.2 45.0 211.2  Sleep % 49.4 0.0 39.8 10.8 50.6  pRDI 8.5 N/A 25.5 N/A 27.3  pAHI 3.4 N/A 25.5 N/A 27.3  ODI 1.1 N/A 5.7 N/A 7.8     Snoring Statistics Snoring Level (dB) >40 >50 >60 >70 >80 >Threshold (45)  Sleep (min) 386.4 207.2 77.2 0.0 0.0 267.5  Sleep % 92.5 49.6 18.5 0.0 0.0 64.0    Mean: 51 dB

## 2019-01-17 NOTE — Telephone Encounter (Signed)
-----   Message from Larey Seat, MD sent at 01/17/2019  4:12 PM EDT ----- Summary & Diagnosis:   Surprisingly mild sleep apnea (OSA) with an AHI of just 10.7/h.  Also surprising was the very strong REM accentuation to REM AHI  of 82.3/h within a presumed REM proportion of 20% of the recorded  sleep time.   Recommendations:     REM dependent apnea needs Positive Airway Pressure therapy and  can always benefit from weight loss.  I will order an autotitration CAP device with heated humidity and  mask of patient's choice and comfort with the setting of 6-16 cm  water, 2 cm water EPR.   Electronically Signed: Larey Seat, MD  01-17-2019    Myriam Jacobson, please assure that the primary physician will get this result, too.  Cc Rachell Cipro, MD

## 2019-03-15 ENCOUNTER — Ambulatory Visit: Payer: Self-pay | Admitting: Adult Health

## 2019-03-31 ENCOUNTER — Telehealth: Payer: Self-pay

## 2019-03-31 NOTE — Telephone Encounter (Signed)
Unable to get in contact with [facility]. LVM asking them to correct the patient's last name in [place] as they have it as "Uplinger" and her last name is "Randol Kern". Office number was provided.

## 2019-04-04 ENCOUNTER — Ambulatory Visit: Payer: Self-pay | Admitting: Adult Health

## 2019-04-04 ENCOUNTER — Encounter: Payer: Self-pay | Admitting: Adult Health

## 2019-07-14 ENCOUNTER — Ambulatory Visit (INDEPENDENT_AMBULATORY_CARE_PROVIDER_SITE_OTHER): Payer: 59 | Admitting: Adult Health

## 2019-07-14 ENCOUNTER — Encounter: Payer: Self-pay | Admitting: Adult Health

## 2019-07-14 ENCOUNTER — Other Ambulatory Visit: Payer: Self-pay

## 2019-07-14 VITALS — BP 129/84 | HR 94 | Temp 97.2°F | Ht 65.0 in | Wt 258.8 lb

## 2019-07-14 DIAGNOSIS — G4733 Obstructive sleep apnea (adult) (pediatric): Secondary | ICD-10-CM

## 2019-07-14 DIAGNOSIS — Z9989 Dependence on other enabling machines and devices: Secondary | ICD-10-CM | POA: Diagnosis not present

## 2019-07-14 NOTE — Patient Instructions (Signed)

## 2019-07-14 NOTE — Progress Notes (Signed)
PATIENT: Rachel Ritter DOB: 07/28/1990  REASON FOR VISIT: follow up HISTORY FROM: patient  HISTORY OF PRESENT ILLNESS: Today 07/14/19:  Ms. Mccollom is a 29 year old female with a history of obstructive sleep apnea on CPAP.  Her download indicates that she used her machine 29 out of 30 days for compliance of 97%.  She is using machine greater than 4 hours 25 days for compliance of 83%.  On average she uses her machine 5 hours and 15 minutes.  Her residual AHI is 1.3 on 6 to 16 cm of water with EPR 2.  Her leak in the 95th percentile is 27.7 L/min.  She reports that the CPAP is working well for her.  She returns today for an evaluation.   HISTORY (Copied from Dr.Dohmeier's note)   Sleep and medical history: Dr Duanne Guess mentioned HTN on 3 medications, allergies, possible anemia, weight gain, and snoring since early childhood. ENT evaluation in elementary school did not show anything.    Family sleep and medical history: no apnea in family members, insomnia in her mother. Mother has HTN, father has HTN. Paternal GM had DM.    Social history: married, 19 year old son, lives in a household with 5 adults, 2 children and one teenager.  Working from home- no tobacco use, no ETOH- use at all. Caffeine : 1 a day.  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   Sleep habits are as follows:  Dinner time is at 6-9 PM, depending on sports and work hours.  Bedtime 8 Pm - 12,midnight - the same variability for the children.  Bedroom is neither cool, nor quiet nor dark.  29 year old invades the bedroom, husband watching TV in bed. Sleep position is side and on 3-5 pillows.  She sleeps 3 hours before having to go to the bathroom. Can go back easily.  Her alarm rings between 7 and 8 AM. She sleeps through up to that point, wakes not refreshed or restored.  She takes no naps.     REVIEW OF SYSTEMS: Out of a complete 14 system review of  symptoms, the patient complains only of the following symptoms, and all other reviewed systems are negative.  See HPI  ALLERGIES: Allergies  Allergen Reactions  . Amoxicillin Hives    Has patient had a PCN reaction causing immediate rash, facial/tongue/throat swelling, SOB or lightheadedness with hypotension: No Has patient had a PCN reaction causing severe rash involving mucus membranes or skin necrosis: No Has patient had a PCN reaction that required hospitalization No Has patient had a PCN reaction occurring within the last 10 years: Yes If all of the above answers are "NO", then may proceed with Cephalosporin use.   Marland Kitchen Amoxicillin     HOME MEDICATIONS: Outpatient Medications Prior to Visit  Medication Sig Dispense Refill  . Ferrous Gluconate-C-Folic Acid (IRON-C PO) Take 1 tablet by mouth 2 (two) times daily.    . Multiple Vitamin (MULTIVITAMIN) tablet Take 1 tablet by mouth daily.    Marland Kitchen olmesartan-hydrochlorothiazide (BENICAR HCT) 20-12.5 MG tablet TK 1 T PO D    . NORLYDA 0.35 MG tablet     . clindamycin (CLEOCIN) 300 MG capsule Take 1 capsule (300 mg total) by mouth 3 (three) times daily. (Patient not taking: Reported on 05/11/2016) 30 capsule 0  . enalapril (VASOTEC) 10 MG tablet Take 1 tablet (10 mg total) by mouth 2 (two) times daily.  60 tablet 1  . enalapril (VASOTEC) 10 MG tablet TAKE 1 TABLET BY MOUTH DAILY (Patient not taking: Reported on 09/10/2016) 30 tablet 0  . Guaifenesin 1200 MG TB12 Take 1 tablet (1,200 mg total) by mouth 2 (two) times daily. (Patient not taking: Reported on 09/10/2016) 20 each 0  . hydrocortisone-pramoxine (PROCTOFOAM HC) rectal foam Place 1 applicator rectally 2 (two) times daily. (Patient not taking: Reported on 05/11/2016) 10 g 0  . ibuprofen (ADVIL,MOTRIN) 200 MG tablet Take 600 mg by mouth every 6 (six) hours as needed for moderate pain.    Marland Kitchen ibuprofen (ADVIL,MOTRIN) 800 MG tablet Take 1 tablet (800 mg total) by mouth 3 (three) times daily.  (Patient not taking: Reported on 06/15/2016) 21 tablet 0  . loratadine (CLARITIN) 10 MG tablet Take 10 mg by mouth daily.    . metroNIDAZOLE (FLAGYL) 500 MG tablet Take 1 tablet (500 mg total) by mouth 2 (two) times daily. (Patient not taking: Reported on 06/15/2016) 14 tablet 0  . NIFEdipine (PROCARDIA-XL/ADALAT CC) 60 MG 24 hr tablet Take 1 tablet (60 mg total) by mouth daily. (Patient not taking: Reported on 05/11/2016) 30 tablet 3  . promethazine-dextromethorphan (PROMETHAZINE-DM) 6.25-15 MG/5ML syrup Take 5 mLs by mouth 4 (four) times daily as needed for cough. (Patient not taking: Reported on 09/10/2016) 120 mL 0   No facility-administered medications prior to visit.    PAST MEDICAL HISTORY: Past Medical History:  Diagnosis Date  . Headache   . Hypertension   . Medical history non-contributory   . Pregnancy induced hypertension     PAST SURGICAL HISTORY: Past Surgical History:  Procedure Laterality Date  . NO PAST SURGERIES      FAMILY HISTORY: Family History  Problem Relation Age of Onset  . Brain cancer Maternal Grandmother   . Lung cancer Maternal Grandmother   . Leukemia Maternal Grandfather   . Diabetes Paternal Grandmother   . Stroke Paternal Grandmother   . Prostate cancer Paternal Grandfather   . Hypertension Mother   . Hypertension Sister   . Hypertension Maternal Aunt   . Hypertension Maternal Grandfather     SOCIAL HISTORY: Social History   Socioeconomic History  . Marital status: Married    Spouse name: Not on file  . Number of children: Not on file  . Years of education: Not on file  . Highest education level: Not on file  Occupational History  . Not on file  Tobacco Use  . Smoking status: Never Smoker  . Smokeless tobacco: Never Used  Substance and Sexual Activity  . Alcohol use: Never  . Drug use: Never  . Sexual activity: Never    Birth control/protection: None  Other Topics Concern  . Not on file  Social History Narrative   ** Merged  History Encounter **       Social Determinants of Health   Financial Resource Strain:   . Difficulty of Paying Living Expenses: Not on file  Food Insecurity:   . Worried About Programme researcher, broadcasting/film/video in the Last Year: Not on file  . Ran Out of Food in the Last Year: Not on file  Transportation Needs:   . Lack of Transportation (Medical): Not on file  . Lack of Transportation (Non-Medical): Not on file  Physical Activity:   . Days of Exercise per Week: Not on file  . Minutes of Exercise per Session: Not on file  Stress:   . Feeling of Stress : Not on file  Social Connections:   .  Frequency of Communication with Friends and Family: Not on file  . Frequency of Social Gatherings with Friends and Family: Not on file  . Attends Religious Services: Not on file  . Active Member of Clubs or Organizations: Not on file  . Attends Archivist Meetings: Not on file  . Marital Status: Not on file  Intimate Partner Violence:   . Fear of Current or Ex-Partner: Not on file  . Emotionally Abused: Not on file  . Physically Abused: Not on file  . Sexually Abused: Not on file      PHYSICAL EXAM  Vitals:   07/14/19 0812  BP: 129/84  Pulse: 94  Temp: (!) 97.2 F (36.2 C)  TempSrc: Oral  Weight: 258 lb 12.8 oz (117.4 kg)  Height: 5\' 5"  (1.651 m)   Body mass index is 43.07 kg/m.  Generalized: Well developed, in no acute distress  Chest: Lungs clear to auscultation bilaterally  Neurological examination  Mentation: Alert oriented to time, place, history taking. Follows all commands speech and language fluent Cranial nerve II-XII: Extraocular movements were full, visual field were full on confrontational test Head turning and shoulder shrug  were normal and symmetric. Motor: The motor testing reveals 5 over 5 strength of all 4 extremities. Good symmetric motor tone is noted throughout.  Sensory: Sensory testing is intact to soft touch on all 4 extremities. No evidence of extinction  is noted.  Gait and station: Gait is normal.    DIAGNOSTIC DATA (LABS, IMAGING, TESTING) - I reviewed patient records, labs, notes, testing and imaging myself where available.  Lab Results  Component Value Date   WBC 6.2 06/08/2017   HGB 12.7 06/08/2017   HCT 38.5 06/08/2017   MCV 83.0 06/08/2017   PLT 275 06/08/2017      Component Value Date/Time   NA 137 06/08/2017 0400   K 4.0 06/08/2017 0400   CL 105 06/08/2017 0400   CO2 26 06/08/2017 0400   GLUCOSE 119 (H) 06/08/2017 0400   BUN 15 06/08/2017 0400   CREATININE 0.57 06/08/2017 0400   CALCIUM 8.6 (L) 06/08/2017 0400   PROT 6.9 05/11/2016 0625   ALBUMIN 4.0 05/11/2016 0625   AST 43 (H) 05/11/2016 0625   ALT 23 05/11/2016 0625   ALKPHOS 88 05/11/2016 0625   BILITOT 0.9 05/11/2016 0625   GFRNONAA >60 06/08/2017 0400   GFRAA >60 06/08/2017 0400     ASSESSMENT AND PLAN 29 y.o. year old female  has a past medical history of Headache, Hypertension, Medical history non-contributory, and Pregnancy induced hypertension. here with :   1. Obstructive sleep apnea on CPAP  Patient CPAP download shows excellent compliance and good treatment of her apnea.  She is encouraged to continue using CPAP nightly and greater than 4 hours each night.  She is advised that if her symptoms worsen or she develops new symptoms she should let us know.  She will follow-up in 1 year or sooner if needed    I spent 15 minutes with the patient. 50% of this time was spent reviewing CPAP download   Ward Givens, MSN, NP-C 07/14/2019, 8:19 AM [facility] [address], [place], [place] [address] [phone]

## 2019-10-24 ENCOUNTER — Emergency Department (HOSPITAL_COMMUNITY)
Admission: EM | Admit: 2019-10-24 | Discharge: 2019-10-24 | Disposition: A | Discharge status: Home / Self Care | Payer: 59 | Attending: Emergency Medicine | Admitting: Emergency Medicine

## 2019-10-24 ENCOUNTER — Emergency Department (HOSPITAL_COMMUNITY): Payer: 59

## 2019-10-24 ENCOUNTER — Encounter (HOSPITAL_COMMUNITY): Payer: Self-pay

## 2019-10-24 ENCOUNTER — Other Ambulatory Visit: Payer: Self-pay

## 2019-10-24 DIAGNOSIS — I10 Essential (primary) hypertension: Secondary | ICD-10-CM | POA: Insufficient documentation

## 2019-10-24 DIAGNOSIS — Z91040 Latex allergy status: Secondary | ICD-10-CM | POA: Insufficient documentation

## 2019-10-24 DIAGNOSIS — R232 Flushing: Secondary | ICD-10-CM | POA: Diagnosis not present

## 2019-10-24 DIAGNOSIS — N390 Urinary tract infection, site not specified: Secondary | ICD-10-CM | POA: Diagnosis not present

## 2019-10-24 DIAGNOSIS — Z79899 Other long term (current) drug therapy: Secondary | ICD-10-CM | POA: Diagnosis not present

## 2019-10-24 DIAGNOSIS — R000 Tachycardia, unspecified: Secondary | ICD-10-CM

## 2019-10-24 DIAGNOSIS — R202 Paresthesia of skin: Secondary | ICD-10-CM | POA: Diagnosis not present

## 2019-10-24 DIAGNOSIS — Z20822 Contact with and (suspected) exposure to covid-19: Secondary | ICD-10-CM | POA: Diagnosis not present

## 2019-10-24 LAB — BASIC METABOLIC PANEL
Anion gap: 9 (ref 5–15)
BUN: 14 mg/dL (ref 6–20)
CO2: 26 mmol/L (ref 22–32)
Calcium: 9.3 mg/dL (ref 8.9–10.3)
Chloride: 104 mmol/L (ref 98–111)
Creatinine, Ser: 0.83 mg/dL (ref 0.44–1.00)
GFR calc Af Amer: >60 mL/min (ref >60)
GFR calc non Af Amer: >60 mL/min (ref >60)
Glucose, Bld: 116 mg/dL — ABNORMAL HIGH (ref 70–99)
Potassium: 3.5 mmol/L (ref 3.5–5.1)
Sodium: 139 mmol/L (ref 135–145)

## 2019-10-24 LAB — URINALYSIS, ROUTINE W REFLEX MICROSCOPIC
Bilirubin Urine: NEGATIVE
Glucose, UA: NEGATIVE mg/dL
Hgb urine dipstick: NEGATIVE
Ketones, ur: 5 mg/dL — AB
Nitrite: NEGATIVE
Protein, ur: 30 mg/dL — AB
Specific Gravity, Urine: 1.034 — ABNORMAL HIGH (ref 1.005–1.030)
pH: 5 (ref 5.0–8.0)

## 2019-10-24 LAB — TROPONIN I (HIGH SENSITIVITY)
Troponin I (High Sensitivity): <2 ng/L (ref <18)
Troponin I (High Sensitivity): <2 ng/L (ref <18)

## 2019-10-24 LAB — I-STAT BETA HCG BLOOD, ED (MC, WL, AP ONLY): I-stat hCG, quantitative: <5 m[IU]/mL (ref <5)

## 2019-10-24 LAB — CBC
HCT: 39.7 % (ref 36.0–46.0)
Hemoglobin: 12.2 g/dL (ref 12.0–15.0)
MCH: 25.7 pg — ABNORMAL LOW (ref 26.0–34.0)
MCHC: 30.7 g/dL (ref 30.0–36.0)
MCV: 83.8 fL (ref 80.0–100.0)
Platelets: 414 10*3/uL — ABNORMAL HIGH (ref 150–400)
RBC: 4.74 MIL/uL (ref 3.87–5.11)
RDW: 15.1 % (ref 11.5–15.5)
WBC: 11 10*3/uL — ABNORMAL HIGH (ref 4.0–10.5)
nRBC: 0 % (ref 0.0–0.2)

## 2019-10-24 LAB — SARS CORONAVIRUS 2 BY RT PCR (HOSPITAL ORDER, PERFORMED IN ~~LOC~~ HOSPITAL LAB): SARS Coronavirus 2: NEGATIVE

## 2019-10-24 LAB — TSH: TSH: 3.695 u[IU]/mL (ref 0.350–4.500)

## 2019-10-24 LAB — MAGNESIUM: Magnesium: 2 mg/dL (ref 1.7–2.4)

## 2019-10-24 LAB — D-DIMER, QUANTITATIVE: D-Dimer, Quant: 0.42 ug/mL-FEU (ref 0.00–0.50)

## 2019-10-24 MED ORDER — SODIUM CHLORIDE 0.9% FLUSH: 3.0000 mL | Freq: Once | INTRAVENOUS | Status: DC

## 2019-10-24 MED ORDER — CEPHALEXIN 500 MG PO CAPS
500.0000 mg | ORAL_CAPSULE | Freq: Once | ORAL | Status: AC
  Administered 2019-10-24: 500 mg via ORAL
  Filled 2019-10-24: qty 1

## 2019-10-24 MED ORDER — SODIUM CHLORIDE 0.9 % IV BOLUS
1000.0000 mL | Freq: Once | INTRAVENOUS | Status: AC
  Administered 2019-10-24: 1000 mL via INTRAVENOUS

## 2019-10-24 MED ORDER — CEPHALEXIN 500 MG PO CAPS: 500.0000 mg | ORAL_CAPSULE | Freq: Four times a day (QID) | ORAL | 0 refills | Status: AC

## 2019-10-24 MED ORDER — LORAZEPAM 1 MG PO TABS
1.0000 mg | ORAL_TABLET | Freq: Once | ORAL | Status: AC
  Administered 2019-10-24: 1 mg via ORAL
  Filled 2019-10-24: qty 1

## 2019-10-24 MED ORDER — IBUPROFEN 800 MG PO TABS
800.0000 mg | ORAL_TABLET | Freq: Once | ORAL | Status: AC
  Administered 2019-10-24: 800 mg via ORAL
  Filled 2019-10-24: qty 1

## 2019-10-24 NOTE — ED Triage Notes (Signed)
Patient states she was sitting at her computer desk today and had an episode of tingling all over. Patient states she checked her pulse at home and at one point it was 131.  Patient denies any chest pain or SOB.

## 2019-10-24 NOTE — Discharge Instructions (Addendum)
Please call your primary care office tomorrow as discussed for close recheck.  Take antibiotic as discussed for possible UTI.  Tomorrow morning, call the cardiology clinic to schedule follow-up appointment with their team.  If you have worsening palpitations, any chest pain or difficulty breathing, episodes of passing out, fever or other new concerning symptom, recommend return to ER for reassessment.

## 2019-10-24 NOTE — ED Provider Notes (Addendum)
[facility] DEPT Provider Note   CSN: [id number] Arrival date & time: 10/24/19  1736     History No chief complaint on file.   Rachel Ritter is a 29 y.o. female.  Presents to ER with concern for elevated heart rate.  Patient states yesterday and today she has been having intermittent sensation of feeling flushed, warm.  States that throughout the day today she is also been having sensation of tingling, tingling sensation is worse in her legs.  No weakness, no fevers, chills.  No chest or abdominal pain, no difficulty in breathing.  Reports history of hypertension, currently on losartan.  Also reports history of depression, on sertraline.  Reports that she was up to 100 mg daily, had stopped but then a few days ago restarted.  HPI     Past Medical History:  Diagnosis Date  . Headache   . Hypertension   . Medical history non-contributory   . Pregnancy induced hypertension     Patient Active Problem List   Diagnosis Date Noted  . Inadequate sleep hygiene 11/09/2018  . HTN, goal below 130/80 11/09/2018  . Absolute anemia 11/09/2018  . Pre-eclampsia in postpartum period 02/26/2016  . Status post vaginal delivery 02/23/2016    Past Surgical History:  Procedure Laterality Date  . NO PAST SURGERIES       OB History    Gravida  1   Para  1   Term  1   Preterm  0   AB  0   Living  1     SAB  0   TAB  0   Ectopic  0   Multiple      Live Births  1           Family History  Problem Relation Age of Onset  . Brain cancer Maternal Grandmother   . Lung cancer Maternal Grandmother   . Leukemia Maternal Grandfather   . Diabetes Paternal Grandmother   . Stroke Paternal Grandmother   . Prostate cancer Paternal Grandfather   . Hypertension Mother   . Hypertension Sister   . Hypertension Maternal Aunt   . Hypertension Maternal Grandfather     Social History   Tobacco Use  . Smoking status: Never Smoker  . Smokeless tobacco:  Never Used  Substance Use Topics  . Alcohol use: Never  . Drug use: Never    Home Medications Prior to Admission medications   Medication Sig Start Date End Date Taking? Authorizing Provider  olmesartan-hydrochlorothiazide (BENICAR HCT) 20-12.5 MG tablet Take 1 tablet by mouth daily.  10/17/18  Yes [provider]  Propylene Glycol (SYSTANE BALANCE OP) Place 1 drop into both eyes daily as needed (for dry eyes).   Yes [provider]  sertraline (ZOLOFT) 100 MG tablet Take 100 mg by mouth daily.   Yes [provider]  cephALEXin (KEFLEX) 500 MG capsule Take 1 capsule (500 mg total) by mouth 4 (four) times daily. 10/24/19   Lucrezia Starch, MD    Allergies    Amoxicillin, Amoxicillin, Latex, and Other  Review of Systems   Review of Systems  Constitutional: Negative for chills and fever.  HENT: Negative for ear pain and sore throat.   Eyes: Negative for pain and visual disturbance.  Respiratory: Negative for cough and shortness of breath.   Cardiovascular: Positive for palpitations. Negative for chest pain.  Gastrointestinal: Negative for abdominal pain and vomiting.  Genitourinary: Negative for dysuria and hematuria.  Musculoskeletal:  Negative for arthralgias and back pain.  Skin: Negative for color change and rash.  Neurological: Positive for numbness. Negative for seizures and syncope.  All other systems reviewed and are negative.   Physical Exam Updated Vital Signs BP 118/84   Pulse (!) 107   Temp 99.7 F (37.6 C) (Oral)   Resp 17   Ht 5\' 5"  (1.651 m)   Wt 120.2 kg   LMP 10/17/2019 (Approximate)   SpO2 99%   BMI 44.10 kg/m   Physical Exam Vitals and nursing note reviewed.  Constitutional:      General: She is not in acute distress.    Appearance: She is well-developed.  HENT:     Head: Normocephalic and atraumatic.  Eyes:     Conjunctiva/sclera: Conjunctivae normal.  Cardiovascular:     Rate and Rhythm: Regular rhythm. Tachycardia  present.     Heart sounds: No murmur.  Pulmonary:     Effort: Pulmonary effort is normal. No respiratory distress.     Breath sounds: Normal breath sounds.  Abdominal:     Palpations: Abdomen is soft.     Tenderness: There is no abdominal tenderness.  Musculoskeletal:     Cervical back: Neck supple.  Skin:    General: Skin is warm and dry.  Neurological:     General: No focal deficit present.     Mental Status: She is alert and oriented to person, place, and time.     Comments: Sensation to light touch intact in all 4 extremities, 5 out of 5 strength in bilateral lower extremities, normal patellar DTRs, no clonus bilaterally     ED Results / Procedures / Treatments   Labs (all labs ordered are listed, but only abnormal results are displayed) Labs Reviewed  BASIC METABOLIC PANEL - Abnormal; Notable for the following components:      Result Value   Glucose, Bld 116 (*)    All other components within normal limits  CBC - Abnormal; Notable for the following components:   WBC 11.0 (*)    MCH 25.7 (*)    Platelets 414 (*)    All other components within normal limits  URINALYSIS, ROUTINE W REFLEX MICROSCOPIC - Abnormal; Notable for the following components:   APPearance CLOUDY (*)    Specific Gravity, Urine 1.034 (*)    Ketones, ur 5 (*)    Protein, ur 30 (*)    Leukocytes,Ua LARGE (*)    Bacteria, UA FEW (*)    All other components within normal limits  SARS CORONAVIRUS 2 BY RT PCR (HOSPITAL ORDER, PERFORMED IN [facility])  TSH  D-DIMER, QUANTITATIVE (NOT AT [facility])  MAGNESIUM  I-STAT BETA HCG BLOOD, ED ([facility], [facility], [facility] ONLY)  TROPONIN I (HIGH SENSITIVITY)  TROPONIN I (HIGH SENSITIVITY)    EKG EKG Interpretation  Date/Time:  Monday Oct 24 2019 22:40:52 EDT Ventricular Rate:  102 PR Interval:    QRS Duration: 85 QT Interval:  341 QTC Calculation: 445 R Axis:   31 Text Interpretation: Sinus tachycardia Low voltage, precordial leads Confirmed by [id number] (Marianna Fuss) on 10/24/2019 11:04:03 PM   Radiology DG Chest 2 View  Result Date: 10/24/2019 CLINICAL DATA:  Episode of tachycardia and tingling all over. EXAM: CHEST - 2 VIEW COMPARISON:  June 08, 2017 FINDINGS: The heart size and mediastinal contours are within normal limits. Both lungs are clear. The visualized skeletal structures are unremarkable. IMPRESSION: No active cardiopulmonary disease. Electronically Signed   By: June 10, 2017.D.  On: 10/24/2019 18:43    Procedures Procedures (including critical care time)  Medications Ordered in ED Medications  sodium chloride flush (NS) 0.9 % injection 3 mL (has no administration in time range)  sodium chloride 0.9 % bolus 1,000 mL (0 mLs Intravenous Stopped 10/24/19 2247)  ibuprofen (ADVIL) tablet 800 mg (800 mg Oral Given 10/24/19 1909)  LORazepam (ATIVAN) tablet 1 mg (1 mg Oral Given 10/24/19 1909)  sodium chloride 0.9 % bolus 1,000 mL (0 mLs Intravenous Stopped 10/24/19 2247)  cephALEXin (KEFLEX) capsule 500 mg (500 mg Oral Given 10/24/19 2246)    ED Course  I have reviewed the triage vital signs and the nursing notes.  Pertinent labs & imaging results that were available during my care of the patient were reviewed by me and considered in my medical decision making (see chart for details).  Clinical Course as of Oct 24 124  Mon Oct 24, 2019  2146 Will update patient  Urinalysis, Routine w reflex microscopic(!) [RD]  2234 Discussed case with cardiology, Rose who reviewed case, EKG; from cards standpoint ok for dc, rec f/u in their clinic, can empirically start beta blocker now or defer to clinic; d/w pt, HR now ~100, pt wishes to discuss further med management with PCP and cards, okay for abx   [RD]    Clinical Course User Index [RD] Milagros Loll, MD   MDM Rules/Calculators/A&P                     29 year old female presenting to ER with concern for elevated heart rate, tingling sensation over her body.  Here  patient was well-appearing, noted to have stable vital signs except for tachycardia.  EKG demonstrating sinus tachycardia.  Based on history, no clear explanation for her tachycardia.  Only symptom was tingling sensation in her body, she had normal comments broad work-up, D-dimer negative, doubt PE.  No acidosis on chemistry, no leukocytosis, nontoxic in appearance, doubt sepsis.  TSH within normal limits, doubt thyroid storm.  Troponin x2 undetectable, doubt ACS.  No anemia on CBC, no electrolyte derangements on chemistry.  UA with possible UTI.  Did not have specific symptoms, but will treat with cephalexin.  Patient provided fluids, anxiolytic, symptom.  On reassessment, her heart rate improved significantly.  She still will have fluctuations nevertheless on reassessment patient would jump from 100s to 130s, all sinus.  Reviewed with on-call cardiology, recommending outpatient follow-up.  Discussed with patient, has PCP appointment tomorrow.  Patient is comfortable and agreeable for discharge plan.  Recommended trial of metoprolol, patient states that she would like to further discuss with primary doctor and cardiology prior to initiating this therapy.  Will discharge home.    After the discussed management above, the patient was determined to be safe for discharge.  The patient was in agreement with this plan and all questions regarding their care were answered.  ED return precautions were discussed and the patient will return to the ED with any significant worsening of condition.   Final Clinical Impression(s) / ED Diagnoses Final diagnoses:  Sinus tachycardia  Urinary tract infection without hematuria, site unspecified    Rx / DC Orders ED Discharge Orders         Ordered    cephALEXin (KEFLEX) 500 MG capsule  4 times daily     10/24/19 2305           Milagros Loll, MD 10/25/19 0126    Milagros Loll, MD 10/25/19 0127

## 2019-11-04 ENCOUNTER — Ambulatory Visit: Payer: 59 | Admitting: Cardiovascular Disease

## 2019-12-02 ENCOUNTER — Ambulatory Visit (INDEPENDENT_AMBULATORY_CARE_PROVIDER_SITE_OTHER): Payer: 59

## 2019-12-02 ENCOUNTER — Ambulatory Visit (INDEPENDENT_AMBULATORY_CARE_PROVIDER_SITE_OTHER): Payer: 59 | Admitting: Podiatry

## 2019-12-02 ENCOUNTER — Other Ambulatory Visit: Payer: Self-pay

## 2019-12-02 DIAGNOSIS — M79672 Pain in left foot: Secondary | ICD-10-CM

## 2019-12-02 DIAGNOSIS — M79671 Pain in right foot: Secondary | ICD-10-CM

## 2019-12-02 DIAGNOSIS — Q666 Other congenital valgus deformities of feet: Secondary | ICD-10-CM | POA: Diagnosis not present

## 2019-12-02 DIAGNOSIS — M722 Plantar fascial fibromatosis: Secondary | ICD-10-CM | POA: Diagnosis not present

## 2019-12-06 ENCOUNTER — Encounter: Payer: Self-pay | Admitting: Podiatry

## 2019-12-06 NOTE — Progress Notes (Signed)
Subjective:  Patient ID: Rachel Ritter, female    DOB: [birth year],  MRN: [id number]  Chief Complaint  Patient presents with   Foot Pain    pt is here for foot pain.    29 y.o. female presents with the above complaint.  Patient presents with bilateral heel pain with underlying flatfoot deformity.  Patient states mass progressive gotten worse has been on for past 2 years.  Is painful to walk on especially at nighttime when getting off of work.  Patient states that there is generalized aches in the arch as well as the heel.  She denies any other acute complaints.  She denies seeing anyone else prior to seeing me.  She would like to discuss various treatment options.  Her pain scale is 6 out of 10 and is dull achy in nature.  She is tends to work on her feet for long period of time and she would like to know if there is anything that could help with this.  She has not seen anyone else prior to seeing me.   Review of Systems: Negative except as noted in the HPI. Denies N/V/F/Ch.  Past Medical History:  Diagnosis Date   Headache    Hypertension    Medical history non-contributory    Pregnancy induced hypertension     Current Outpatient Medications:    azelastine (ASTELIN) 0.1 % nasal spray, SMARTSIG:1-2 Spray(s) Both Nares 1 to 2 Times Daily, Disp: , Rfl:    cephALEXin (KEFLEX) 500 MG capsule, Take 1 capsule (500 mg total) by mouth 4 (four) times daily., Disp: 20 capsule, Rfl: 0   nitrofurantoin, macrocrystal-monohydrate, (MACROBID) 100 MG capsule, Take 100 mg by mouth 2 (two) times daily., Disp: , Rfl:    olmesartan-hydrochlorothiazide (BENICAR HCT) 20-12.5 MG tablet, Take 1 tablet by mouth daily. , Disp: , Rfl:    Propylene Glycol (SYSTANE BALANCE OP), Place 1 drop into both eyes daily as needed (for dry eyes)., Disp: , Rfl:    sertraline (ZOLOFT) 100 MG tablet, Take 100 mg by mouth daily., Disp: , Rfl:   Social History   Tobacco Use  Smoking Status Never Smoker  Smokeless  Tobacco Never Used    Allergies  Allergen Reactions   Amoxicillin Hives    Has patient had a PCN reaction causing immediate rash, facial/tongue/throat swelling, SOB or lightheadedness with hypotension: No Has patient had a PCN reaction causing severe rash involving mucus membranes or skin necrosis: No Has patient had a PCN reaction that required hospitalization No Has patient had a PCN reaction occurring within the last 10 years: Yes If all of the above answers are "NO", then may proceed with Cephalosporin use.    Amoxicillin    Latex Hives   Other Other (See Comments)    Peaches : hives   Objective:  There were no vitals filed for this visit. There is no height or weight on file to calculate BMI. Constitutional Well developed. Well nourished.  Vascular Dorsalis pedis pulses palpable bilaterally. Posterior tibial pulses palpable bilaterally. Capillary refill normal to all digits.  No cyanosis or clubbing noted. Pedal hair growth normal.  Neurologic Normal speech. Oriented to person, place, and time. Epicritic sensation to light touch grossly present bilaterally.  Dermatologic Nails well groomed and normal in appearance. No open wounds. No skin lesions.  Orthopedic: Normal joint ROM without pain or crepitus bilaterally. No visible deformities. Tender to palpation at the calcaneal tuber bilaterally. No pain with calcaneal squeeze bilaterally. Ankle ROM full range of  motion bilaterally. Silfverskiold Test: negative bilaterally.   Radiographs: Taken and reviewed. No acute fractures or dislocations. No evidence of stress fracture.  Plantar heel spur absent. Posterior heel spur absent.  There is decreasing cocaine inclination angle increase in talar declination angle.  Anterior break in the cyma line.  Mild elevatus present.  No other bony abnormalities identified.  Assessment:   1. Foot pain, bilateral   2. Arch pain, right   3. Arch pain, left   4. Pes planovalgus   5.  Plantar fasciitis of right foot   6. Plantar fasciitis of left foot    Plan:  Patient was evaluated and treated and all questions answered.  Plantar Fasciitis, bilaterally secondary to underlying pes planovalgus deformity -Given that patient's pain is very mild in nature and likely attributed from underlying pes planovalgus deformity I plan to believe that patient will benefit from just plantar fascial braces alone to help support the arch of the foot and therefore take the stress off of the plantar fascia.  Ultimately this has to do with the underlying pes planovalgus deformity and the dressing that will help with the plantar fasciitis as well as generalized arch and heel pain.  Patient agrees with the plan. -Bilateral plantar fascial braces were dispensed x2  Pes planovalgus bilaterally -I explained to the patient the etiology of pes planovalgus and various treatment options were extensively discussed.  Given that this is semiflexible deformity I believe patient will benefit from custom-made orthotics to help support the arches of the foot control the hindfoot motion and therefore take the stress off of both of the feet.  Patient agrees with the plan would like to proceed with orthotics -Should be scheduled to see [facility] for custom-made orthotics. .  Return for See Raiford Noble for orthotics ASAP.

## 2019-12-08 ENCOUNTER — Other Ambulatory Visit: Payer: Self-pay | Admitting: Podiatry

## 2019-12-08 DIAGNOSIS — M722 Plantar fascial fibromatosis: Secondary | ICD-10-CM

## 2019-12-19 ENCOUNTER — Other Ambulatory Visit: Payer: Self-pay

## 2019-12-19 ENCOUNTER — Ambulatory Visit (INDEPENDENT_AMBULATORY_CARE_PROVIDER_SITE_OTHER): Payer: 59 | Admitting: Orthotics

## 2019-12-19 DIAGNOSIS — M79671 Pain in right foot: Secondary | ICD-10-CM

## 2019-12-19 DIAGNOSIS — M79672 Pain in left foot: Secondary | ICD-10-CM

## 2019-12-19 DIAGNOSIS — M722 Plantar fascial fibromatosis: Secondary | ICD-10-CM

## 2019-12-19 NOTE — Progress Notes (Signed)
Cast today for f/o to address pes planovalgus; deep heel cup and 3* medial RF post.

## 2020-01-06 ENCOUNTER — Ambulatory Visit (INDEPENDENT_AMBULATORY_CARE_PROVIDER_SITE_OTHER): Payer: 59 | Admitting: Podiatry

## 2020-01-06 ENCOUNTER — Ambulatory Visit: Payer: 59 | Admitting: Orthotics

## 2020-01-06 ENCOUNTER — Other Ambulatory Visit: Payer: Self-pay

## 2020-01-06 DIAGNOSIS — M722 Plantar fascial fibromatosis: Secondary | ICD-10-CM

## 2020-01-06 DIAGNOSIS — M79671 Pain in right foot: Secondary | ICD-10-CM

## 2020-01-06 DIAGNOSIS — Q666 Other congenital valgus deformities of feet: Secondary | ICD-10-CM | POA: Diagnosis not present

## 2020-01-06 NOTE — Progress Notes (Signed)
Patient came in today to pick up custom made foot orthotics.  The goals were accomplished and the patient reported no dissatisfaction with said orthotics.  Patient was advised of breakin period and how to report any issues. 

## 2020-01-09 ENCOUNTER — Encounter: Payer: 59 | Admitting: Orthotics

## 2020-01-09 ENCOUNTER — Encounter: Payer: Self-pay | Admitting: Podiatry

## 2020-01-09 NOTE — Progress Notes (Signed)
Subjective:  Patient ID: Rachel Ritter, female    DOB: Dec 18, 1990,  MRN: [id number]  Chief Complaint  Patient presents with  . Follow-up    Bilateral plantar fasciitis. Pt stated, "Overall, it's better. I'm not having pain as often. When I do, it's 7/10. I'm wearing the braces".    29 y.o. female presents with the above complaint.  Patient presents with a follow-up of bilateral plantar fasciitis.  Patient states that the right side there is no pain and has completely resolved.  The patient left side still has a pretty good amount of pain.  Patient states is improved a little bit.  Patient has been wearing braces which has helped.  Patient forgot to bring her shoes in today.  She also will be picking up orthotics today.  She denies any acute complaints.  On the left side her pain scale 7 out of 10 is sharp shooting in nature.   Review of Systems: Negative except as noted in the HPI. Denies N/V/F/Ch.  Past Medical History:  Diagnosis Date  . Headache   . Hypertension   . Medical history non-contributory   . Pregnancy induced hypertension     Current Outpatient Medications:  .  azelastine (ASTELIN) 0.1 % nasal spray, SMARTSIG:1-2 Spray(s) Both Nares 1 to 2 Times Daily, Disp: , Rfl:  .  nitrofurantoin, macrocrystal-monohydrate, (MACROBID) 100 MG capsule, Take 100 mg by mouth 2 (two) times daily., Disp: , Rfl:  .  olmesartan-hydrochlorothiazide (BENICAR HCT) 20-12.5 MG tablet, Take 1 tablet by mouth daily. , Disp: , Rfl:  .  Propylene Glycol (SYSTANE BALANCE OP), Place 1 drop into both eyes daily as needed (for dry eyes)., Disp: , Rfl:  .  sertraline (ZOLOFT) 100 MG tablet, Take 100 mg by mouth daily., Disp: , Rfl:  .  cephALEXin (KEFLEX) 500 MG capsule, Take 1 capsule (500 mg total) by mouth 4 (four) times daily., Disp: 20 capsule, Rfl: 0  Social History   Tobacco Use  Smoking Status Never Smoker  Smokeless Tobacco Never Used    Allergies  Allergen Reactions  . Amoxicillin Hives      Has patient had a PCN reaction causing immediate rash, facial/tongue/throat swelling, SOB or lightheadedness with hypotension: No Has patient had a PCN reaction causing severe rash involving mucus membranes or skin necrosis: No Has patient had a PCN reaction that required hospitalization No Has patient had a PCN reaction occurring within the last 10 years: Yes If all of the above answers are "NO", then may proceed with Cephalosporin use.   Marland Kitchen Amoxicillin   . Latex Hives  . Other Other (See Comments)    Peaches : hives   Objective:  There were no vitals filed for this visit. There is no height or weight on file to calculate BMI. Constitutional Well developed. Well nourished.  Vascular Dorsalis pedis pulses palpable bilaterally. Posterior tibial pulses palpable bilaterally. Capillary refill normal to all digits.  No cyanosis or clubbing noted. Pedal hair growth normal.  Neurologic Normal speech. Oriented to person, place, and time. Epicritic sensation to light touch grossly present bilaterally.  Dermatologic Nails well groomed and normal in appearance. No open wounds. No skin lesions.  Orthopedic: Normal joint ROM without pain or crepitus bilaterally. No visible deformities. Tender to palpation at the calcaneal tuber left No pain with calcaneal squeeze bilaterally. Ankle ROM full range of motion bilaterally. Silfverskiold Test: negative bilaterally.   Radiographs: Taken and reviewed. No acute fractures or dislocations. No evidence of stress fracture.  Plantar heel spur absent. Posterior heel spur absent.  There is decreasing cocaine inclination angle increase in talar declination angle.  Anterior break in the cyma line.  Mild elevatus present.  No other bony abnormalities identified.  Assessment:   1. Plantar fasciitis of left foot   2. Pes planovalgus    Plan:  Patient was evaluated and treated and all questions answered. Right plantar foot -Completely resolved with a  steroid injection and bracing  Plantar Fasciitis, left  -I believe patient will benefit from a steroid injection as well as continued use of plantar fascial braces.  Patient orthotics will be picked up today.  I encouraged her to use her orthotics.  I also discussed with her break-in period.  She states understanding.  Pes planovalgus bilaterally -I explained to the patient the etiology of pes planovalgus and various treatment options were extensively discussed.  Given that this is semiflexible deformity I believe patient will benefit from custom-made orthotics to help support the arches of the foot control the hindfoot motion and therefore take the stress off of both of the feet.  Patient agrees with the plan would like to proceed with orthotics -Orthotics will be picked up today.   Procedure: Injection Tendon/Ligament Location: Left plantar fascia at the glabrous junction; medial approach. Skin Prep: alcohol Injectate: 0.5 cc 0.5% marcaine plain, 0.5 cc of 1% Lidocaine, 0.5 cc kenalog 10. Disposition: Patient tolerated procedure well. Injection site dressed with a band-aid.  No follow-ups on file. .  No follow-ups on file.

## 2020-01-10 ENCOUNTER — Other Ambulatory Visit: Payer: Self-pay | Admitting: Family Medicine

## 2020-01-10 DIAGNOSIS — N63 Unspecified lump in unspecified breast: Secondary | ICD-10-CM

## 2020-01-16 ENCOUNTER — Other Ambulatory Visit: Payer: Self-pay | Admitting: Obstetrics & Gynecology

## 2020-01-16 DIAGNOSIS — N979 Female infertility, unspecified: Secondary | ICD-10-CM

## 2020-01-18 ENCOUNTER — Other Ambulatory Visit: Payer: Self-pay

## 2020-01-18 ENCOUNTER — Ambulatory Visit: Payer: 59

## 2020-01-18 ENCOUNTER — Ambulatory Visit
Admission: RE | Admit: 2020-01-18 | Discharge: 2020-01-18 | Disposition: A | Discharge status: Home / Self Care | Payer: 59 | Source: Ambulatory Visit | Attending: Family Medicine | Admitting: Family Medicine

## 2020-01-18 DIAGNOSIS — N63 Unspecified lump in unspecified breast: Secondary | ICD-10-CM

## 2020-02-22 ENCOUNTER — Ambulatory Visit: Payer: 59 | Admitting: Podiatry

## 2020-02-24 ENCOUNTER — Ambulatory Visit (HOSPITAL_COMMUNITY)
Admission: EM | Admit: 2020-02-24 | Discharge: 2020-02-24 | Disposition: A | Discharge status: Home / Self Care | Payer: 59 | Attending: Internal Medicine | Admitting: Internal Medicine

## 2020-02-24 ENCOUNTER — Other Ambulatory Visit: Payer: Self-pay

## 2020-02-24 ENCOUNTER — Encounter (HOSPITAL_COMMUNITY): Payer: Self-pay

## 2020-02-24 DIAGNOSIS — R519 Headache, unspecified: Secondary | ICD-10-CM

## 2020-02-24 DIAGNOSIS — Z79899 Other long term (current) drug therapy: Secondary | ICD-10-CM | POA: Insufficient documentation

## 2020-02-24 DIAGNOSIS — J069 Acute upper respiratory infection, unspecified: Secondary | ICD-10-CM | POA: Insufficient documentation

## 2020-02-24 DIAGNOSIS — Z20822 Contact with and (suspected) exposure to covid-19: Secondary | ICD-10-CM | POA: Insufficient documentation

## 2020-02-24 DIAGNOSIS — I10 Essential (primary) hypertension: Secondary | ICD-10-CM | POA: Insufficient documentation

## 2020-02-24 DIAGNOSIS — J029 Acute pharyngitis, unspecified: Secondary | ICD-10-CM

## 2020-02-24 LAB — POCT RAPID STREP A, ED / UC: Streptococcus, Group A Screen (Direct): NEGATIVE

## 2020-02-24 MED ORDER — FLUTICASONE PROPIONATE 50 MCG/ACT NA SUSP: 1.0000 | Freq: Every day | NASAL | 2 refills | Status: AC

## 2020-02-24 MED ORDER — CEPACOL SORE THROAT 5.4 MG MT LOZG: 1.0000 | LOZENGE | OROMUCOSAL | 0 refills | Status: AC | PRN

## 2020-02-24 MED ORDER — ACETAMINOPHEN 500 MG PO TABS
1000.0000 mg | ORAL_TABLET | Freq: Three times a day (TID) | ORAL | 0 refills | Status: AC | PRN
Start: 2020-02-24 — End: ?

## 2020-02-24 NOTE — ED Triage Notes (Signed)
Pt presents with headache and sore throat X 2 days. 

## 2020-02-24 NOTE — ED Provider Notes (Signed)
[facility]    CSN: [id number] Arrival date & time: 02/24/20  1816      History   Chief Complaint Chief Complaint  Patient presents with  . Headache  . Sore Throat    HPI Rachel Ritter is a 29 y.o. female.   Patient reports for headache and sore throat.  She reports symptoms started 2 days ago.  She reports she had a slight cough initially when symptoms started.  She reports sore throat is been well managed with lozenges.  She reports frontal headache.  8 out of 10 in intensity.  Described as pressure in the front.  She has not taken any medications for the headache.  Denies runny nose, ear pain, nausea, vomiting, diarrhea.  Denies change in taste smell.  Did receive both Covid vaccines.  She reports her brother just did recover from Covid and ended his 10-day quarantine earlier this week.     Past Medical History:  Diagnosis Date  . Headache   . Hypertension   . Medical history non-contributory   . Pregnancy induced hypertension     Patient Active Problem List   Diagnosis Date Noted  . Inadequate sleep hygiene 11/09/2018  . HTN, goal below 130/80 11/09/2018  . Absolute anemia 11/09/2018  . Pre-eclampsia in postpartum period 02/26/2016  . Status post vaginal delivery 02/23/2016    Past Surgical History:  Procedure Laterality Date  . NO PAST SURGERIES      OB History    Gravida  1   Para  1   Term  1   Preterm  0   AB  0   Living  1     SAB  0   TAB  0   Ectopic  0   Multiple      Live Births  1            Home Medications    Prior to Admission medications   Medication Sig Start Date End Date Taking? Authorizing Provider  acetaminophen (TYLENOL) 500 MG tablet Take 2 tablets (1,000 mg total) by mouth every 8 (eight) hours as needed. 02/24/20   Telesha Deguzman, Veryl Speak, PA-C  azelastine (ASTELIN) 0.1 % nasal spray SMARTSIG:1-2 Spray(s) Both Nares 1 to 2 Times Daily 11/30/19   [provider]  cephALEXin (KEFLEX) 500 MG capsule  Take 1 capsule (500 mg total) by mouth 4 (four) times daily. 10/24/19   Milagros Loll, MD  fluticasone (FLONASE) 50 MCG/ACT nasal spray Place 1 spray into both nostrils daily. 02/24/20   Partick Musselman, Veryl Speak, PA-C  Menthol (CEPACOL SORE THROAT) 5.4 MG LOZG Use as directed 1 lozenge (5.4 mg total) in the mouth or throat every 2 (two) hours as needed. 02/24/20   Dianca Owensby, Veryl Speak, PA-C  nitrofurantoin, macrocrystal-monohydrate, (MACROBID) 100 MG capsule Take 100 mg by mouth 2 (two) times daily. 11/17/19   [provider]  olmesartan-hydrochlorothiazide (BENICAR HCT) 20-12.5 MG tablet Take 1 tablet by mouth daily.  10/17/18   [provider]  Propylene Glycol (SYSTANE BALANCE OP) Place 1 drop into both eyes daily as needed (for dry eyes).    [provider]  sertraline (ZOLOFT) 100 MG tablet Take 100 mg by mouth daily.    [provider]    Family History Family History  Problem Relation Age of Onset  . Brain cancer Maternal Grandmother   . Lung cancer Maternal Grandmother   . Leukemia Maternal Grandfather   . Diabetes Paternal Grandmother   .  Stroke Paternal Grandmother   . Prostate cancer Paternal Grandfather   . Hypertension Mother   . Hypertension Sister   . Hypertension Maternal Aunt   . Hypertension Maternal Grandfather     Social History Social History   Tobacco Use  . Smoking status: Never Smoker  . Smokeless tobacco: Never Used  Vaping Use  . Vaping Use: Never used  Substance Use Topics  . Alcohol use: Never  . Drug use: Never     Allergies   Amoxicillin, Amoxicillin, Latex, and Other   Review of Systems Review of Systems   Physical Exam Triage Vital Signs ED Triage Vitals  Enc Vitals Group     BP 02/24/20 2006 (!) 139/93     Pulse Rate 02/24/20 2006 95     Resp 02/24/20 2006 20     Temp 02/24/20 2006 98.9 F (37.2 C)     Temp Source 02/24/20 2006 Oral     SpO2 02/24/20 2006 100 %     Weight --      Height --      Head  Circumference --      Peak Flow --      Pain Score 02/24/20 2007 5     Pain Loc --      Pain Edu? --      Excl. in GC? --    No data found.  Updated Vital Signs BP (!) 139/93 (BP Location: Right Arm)   Pulse 95   Temp 98.9 F (37.2 C) (Oral)   Resp 20   SpO2 100%   Visual Acuity Right Eye Distance:   Left Eye Distance:   Bilateral Distance:    Right Eye Near:   Left Eye Near:    Bilateral Near:     Physical Exam Vitals and nursing note reviewed.  Constitutional:      General: She is not in acute distress.    Appearance: She is well-developed. She is not ill-appearing.  HENT:     Head: Normocephalic and atraumatic.     Right Ear: Tympanic membrane normal.     Left Ear: Tympanic membrane normal.     Nose: Congestion present.     Mouth/Throat:     Mouth: Mucous membranes are moist.     Pharynx: Posterior oropharyngeal erythema present.     Comments: Mild erythema but also postnasal drip visible. Eyes:     Extraocular Movements: Extraocular movements intact.     Conjunctiva/sclera: Conjunctivae normal.     Pupils: Pupils are equal, round, and reactive to light.  Cardiovascular:     Rate and Rhythm: Normal rate and regular rhythm.     Heart sounds: No murmur heard.   Pulmonary:     Effort: Pulmonary effort is normal. No respiratory distress.     Breath sounds: Normal breath sounds. No wheezing, rhonchi or rales.  Abdominal:     Palpations: Abdomen is soft.     Tenderness: There is no abdominal tenderness.  Musculoskeletal:     Cervical back: Normal range of motion and neck supple.  Lymphadenopathy:     Cervical: No cervical adenopathy.  Skin:    General: Skin is warm and dry.  Neurological:     Mental Status: She is alert and oriented to person, place, and time.      UC Treatments / Results  Labs (all labs ordered are listed, but only abnormal results are displayed) Labs Reviewed  SARS CORONAVIRUS 2 (TAT 6-24 HRS)  CULTURE, GROUP A STREP [facility])  POCT  RAPID STREP A, ED / UC    EKG   Radiology No results found.  Procedures Procedures (including critical care time)  Medications Ordered in UC Medications - No data to display  Initial Impression / Assessment and Plan / UC Course  I have reviewed the triage vital signs and the nursing notes.  Pertinent labs & imaging results that were available during my care of the patient were reviewed by me and considered in my medical decision making (see chart for details).     #Viral URI Patient is 29 year old presenting with viral upper respiratory infection.  Rapid strep negative, culture sent.  Covid sent.  Given close proximity to Covid positive patient, concern for Covid.  Normal vital signs and overall reassuring exam.  We will treat her symptomatically.  Discussed return, follow-up and emergency department precautions.  Patient verbalized understanding plan of care Final Clinical Impressions(s) / UC Diagnoses   Final diagnoses:  Viral upper respiratory tract infection     Discharge Instructions     The strep test was negative, we sent a culture  Take Tylenol as prescribed Use Cepacol lozenges as prescribed Flonase daily  If not improving over the next several days to week, follow-up with your primary care or return to this clinic   if severe symptoms of, shortness of breath, worse headache of your life or other concerning symptoms go to the emergency department  If your Covid-19 test is positive, you will receive a phone call from [facility] regarding your results. Negative test results are not called. Both positive and negative results area always visible on MyChart. If you do not have a MyChart account, sign up instructions are in your discharge papers.   Persons who are directed to care for themselves at home may discontinue isolation under the following conditions:  . At least 10 days have passed since symptom onset and . At least 24 hours have passed without running  a fever (this means without the use of fever-reducing medications) and . Other symptoms have improved.  Persons infected with COVID-19 who never develop symptoms may discontinue isolation and other precautions 10 days after the date of their first positive COVID-19 test.         ED Prescriptions    Medication Sig Dispense Auth. Provider   acetaminophen (TYLENOL) 500 MG tablet Take 2 tablets (1,000 mg total) by mouth every 8 (eight) hours as needed. 30 tablet Chee Dimon, Veryl Speak, PA-C   Menthol (CEPACOL SORE THROAT) 5.4 MG LOZG Use as directed 1 lozenge (5.4 mg total) in the mouth or throat every 2 (two) hours as needed. 30 lozenge Roda Lauture, Veryl Speak, PA-C   fluticasone (FLONASE) 50 MCG/ACT nasal spray Place 1 spray into both nostrils daily. 15.8 mL Lilliam Chamblee, Veryl Speak, PA-C     PDMP not reviewed this encounter.   Hermelinda Medicus, PA-C 02/24/20 2135

## 2020-02-24 NOTE — Discharge Instructions (Addendum)
The strep test was negative, we sent a culture  Take Tylenol as prescribed Use Cepacol lozenges as prescribed Flonase daily  If not improving over the next several days to week, follow-up with your primary care or return to this clinic   if severe symptoms of, shortness of breath, worse headache of your life or other concerning symptoms go to the emergency department  If your Covid-19 test is positive, you will receive a phone call from [facility] regarding your results. Negative test results are not called. Both positive and negative results area always visible on MyChart. If you do not have a MyChart account, sign up instructions are in your discharge papers.   Persons who are directed to care for themselves at home may discontinue isolation under the following conditions:   At least 10 days have passed since symptom onset and  At least 24 hours have passed without running a fever (this means without the use of fever-reducing medications) and  Other symptoms have improved.  Persons infected with COVID-19 who never develop symptoms may discontinue isolation and other precautions 10 days after the date of their first positive COVID-19 test.

## 2020-02-25 LAB — SARS CORONAVIRUS 2 (TAT 6-24 HRS): SARS Coronavirus 2: NEGATIVE

## 2020-02-27 LAB — CULTURE, GROUP A STREP (THRC)

## 2020-07-17 ENCOUNTER — Telehealth: Payer: 59 | Admitting: Adult Health

## 2021-01-27 IMAGING — US US BREAST*R* LIMITED INC AXILLA
1 series · 9 of 9 positions shown · non-contrast
Comparison: None.

CLINICAL DATA: 1 cm mass felt by the patient in the far medial
aspect of the right breast in 3 o'clock position 2-3 weeks ago. This
was also felt on recent physical examination. She cannot feel the
mass today. No known injury. No family history of breast cancer.

EXAM:
ULTRASOUND OF THE RIGHT BREAST

[Series 1: us breast*right* limited inc axilla · 0.07mm/px · 9 of 9 slices shown]
[im 1/9]
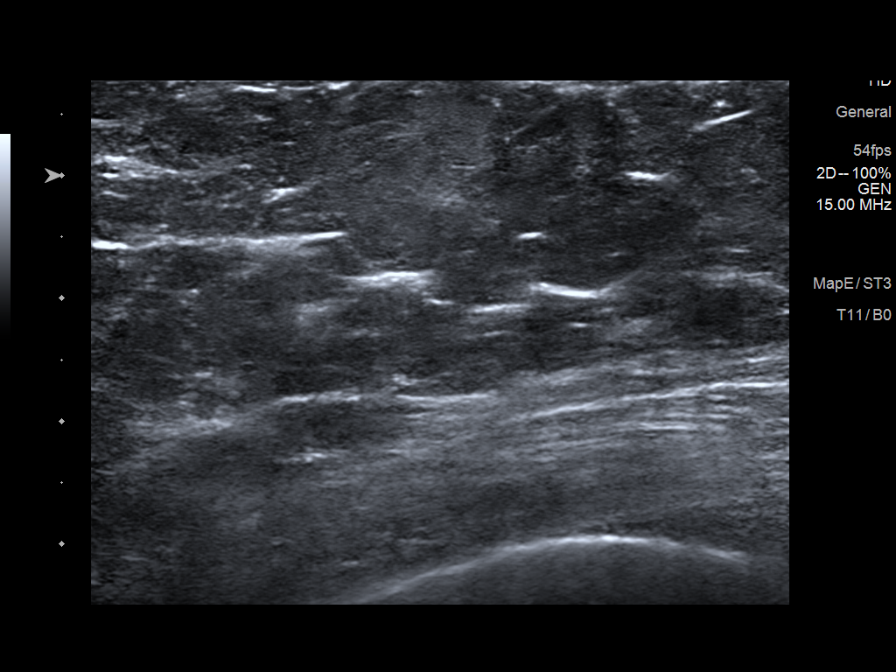
[im 2/9]
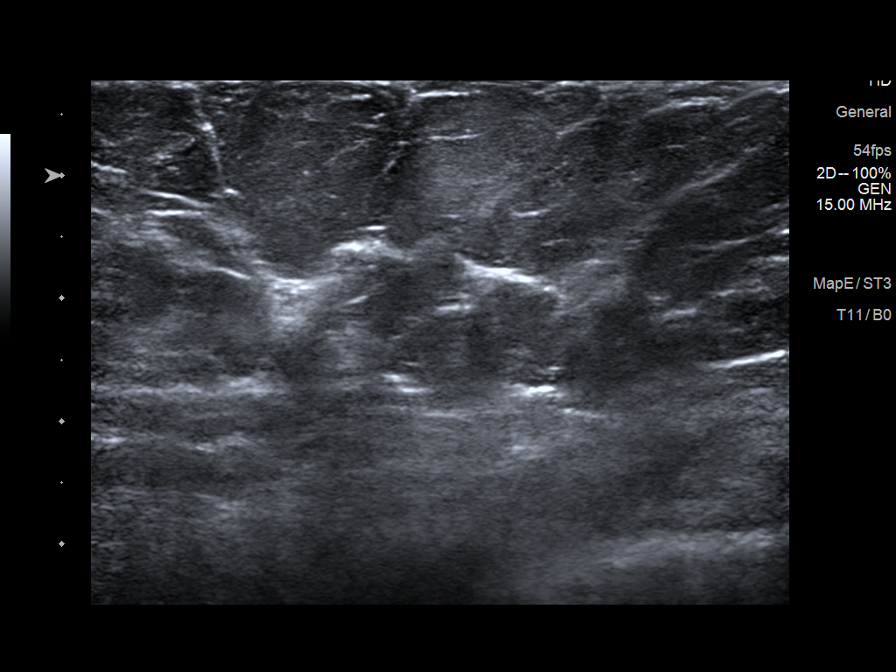
[im 3/9]
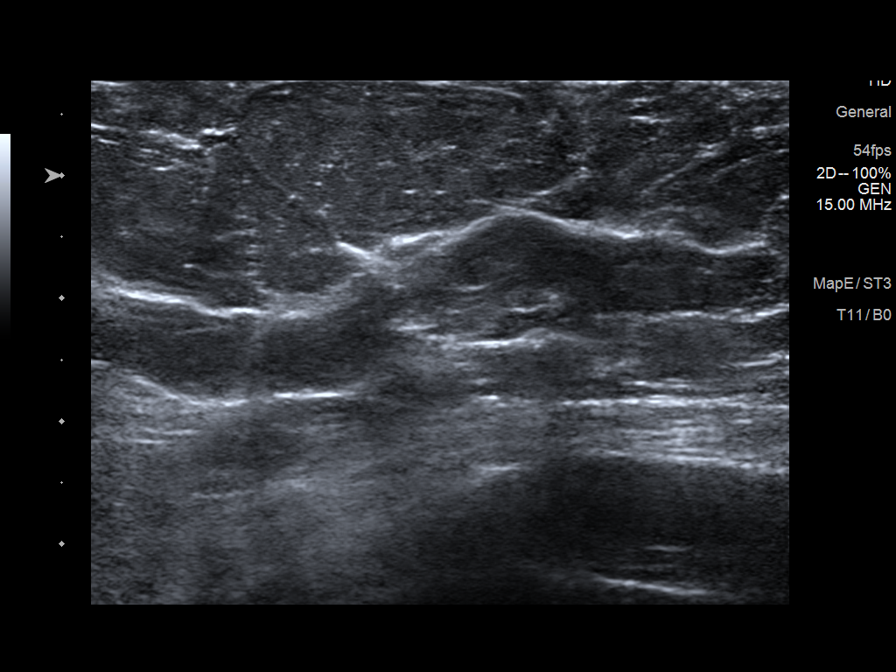
[im 4/9]
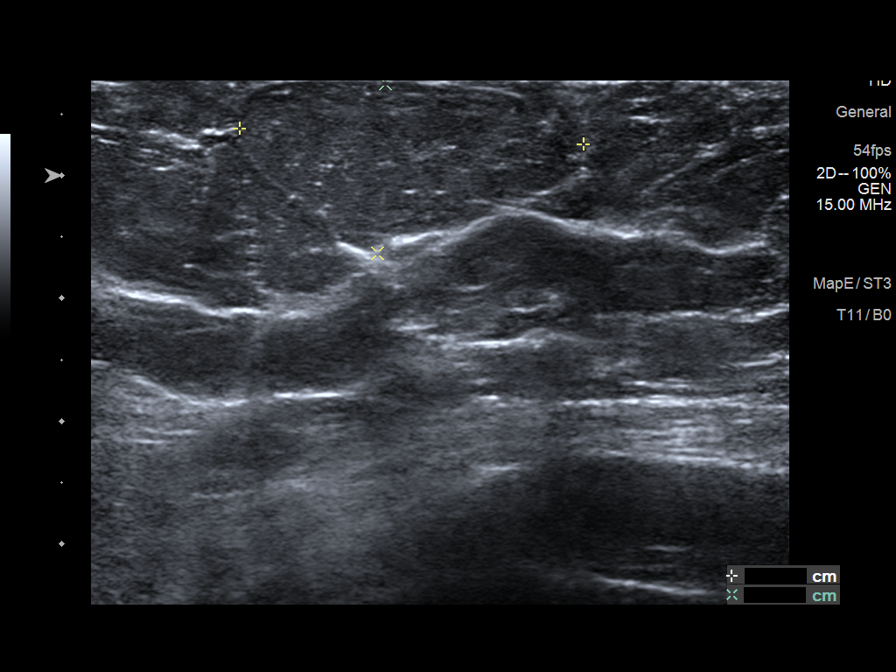
[im 5/9]
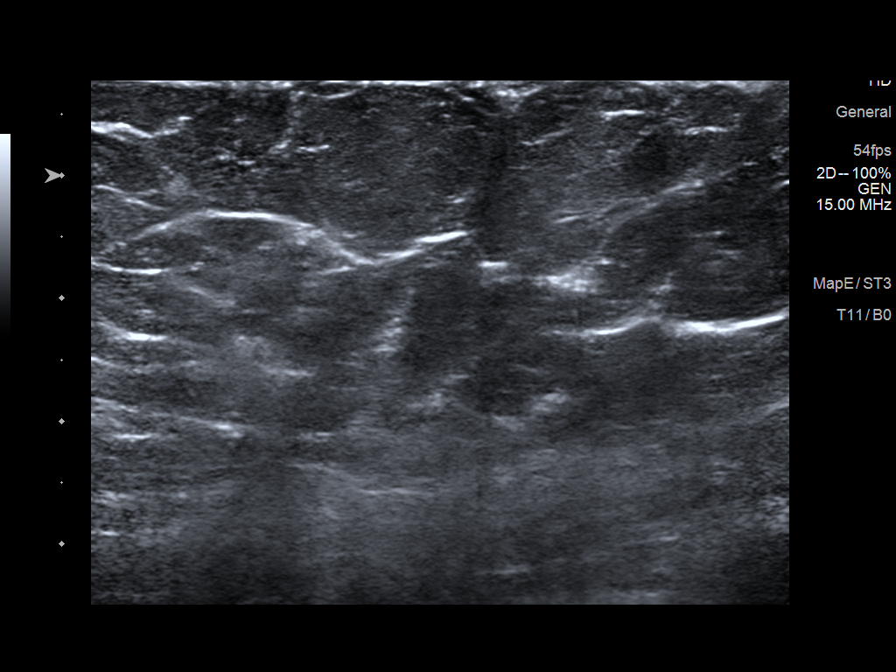
[im 6/9]
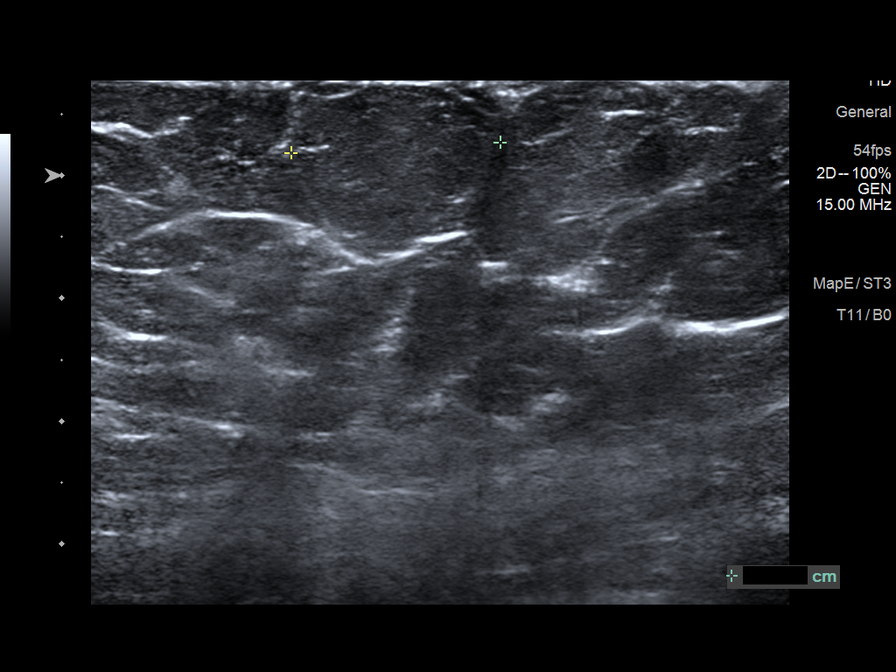
[im 7/9]
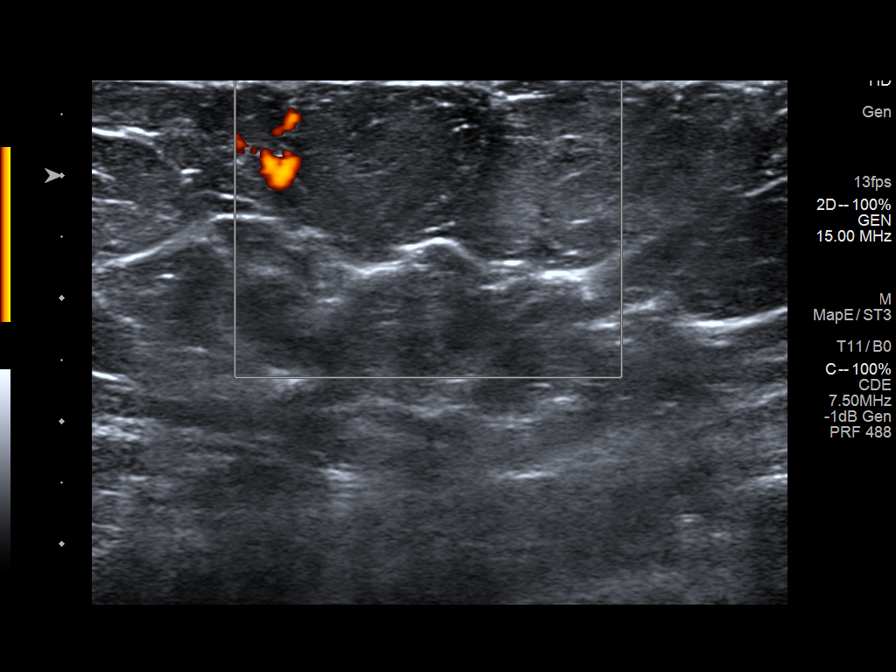
[im 8/9]
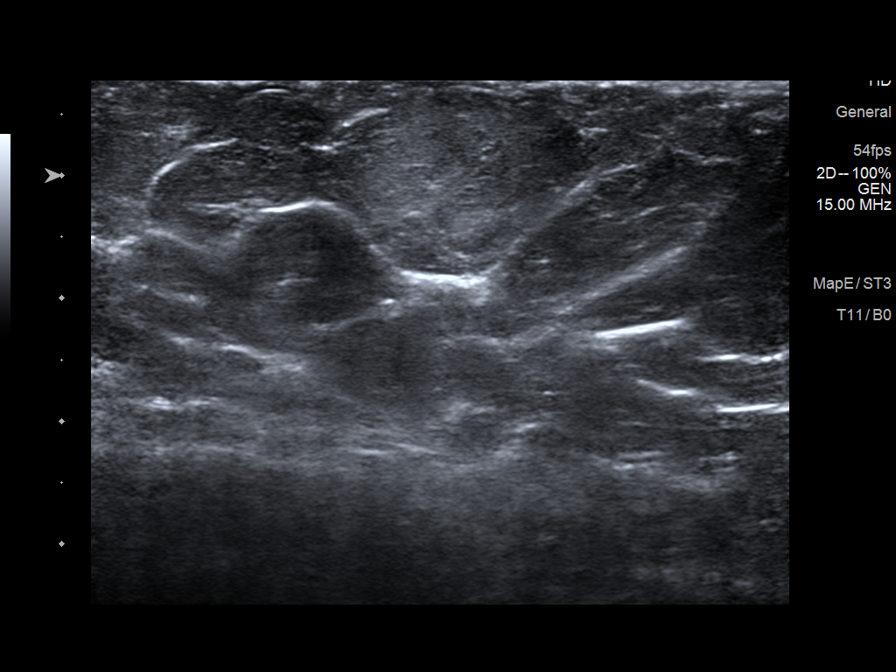
[im 9/9]
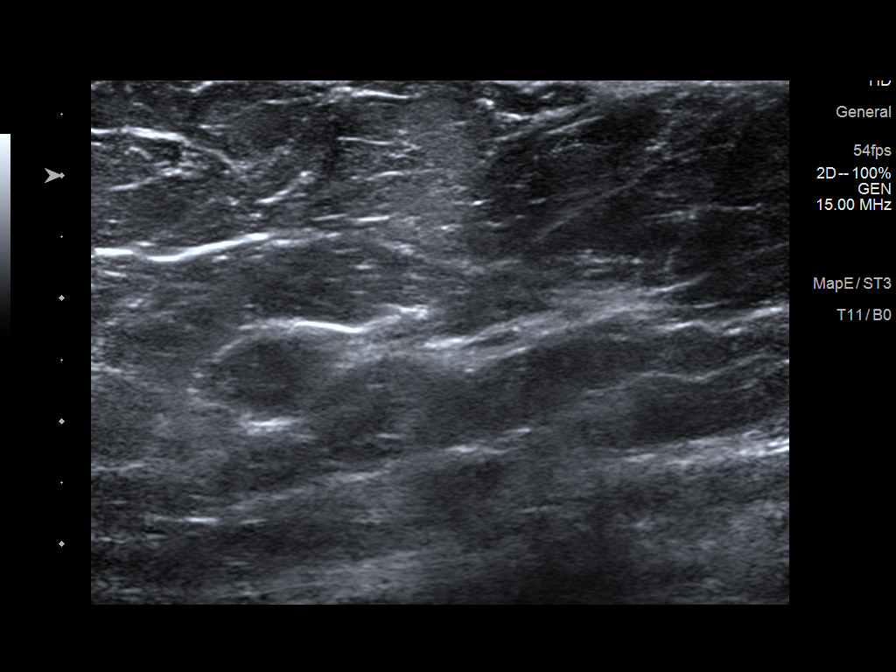

[9 of 9 positions shown; findings below may reference images not displayed]

FINDINGS: On physical exam, no mass is palpable in the far medial right breast
at the location of recent palpable concern.

Targeted ultrasound is performed, showing a subcutaneous fat lobule
containing ill-defined increased echogenicity in the 3 o'clock
position of the right breast, 9 cm from the nipple, at the location
of the initially felt mass. Normal appearing adjacent fat lobules.
IMPRESSION: 1. Area of developing or resolving fat necrosis in a subcutaneous
fat lobule in the 3 o'clock position of the right breast at the
location of the recently palpated mass.
2. No evidence of malignancy.

RECOMMENDATION:
Annual screening mammography beginning at age 40.

I have discussed the findings and recommendations with the patient.
If applicable, a reminder letter will be sent to the patient
regarding the next appointment.

BI-RADS CATEGORY  2: Benign.

## 2022-07-07 ENCOUNTER — Other Ambulatory Visit: Payer: Self-pay | Admitting: Obstetrics & Gynecology

## 2022-07-07 DIAGNOSIS — N925 Other specified irregular menstruation: Secondary | ICD-10-CM

## 2022-07-07 DIAGNOSIS — N926 Irregular menstruation, unspecified: Secondary | ICD-10-CM

## 2022-07-18 ENCOUNTER — Other Ambulatory Visit: Payer: Self-pay | Admitting: Obstetrics & Gynecology
